# Patient Record
Sex: Male | Born: 2000 | Race: White | Hispanic: No | Marital: Single | Smoking: Never smoker
Health system: Southern US, Community
[De-identification: ages and names within clinical notes are randomized; demographics above are authoritative.]

## PROBLEM LIST (undated history)

## (undated) DIAGNOSIS — I7300 Raynaud's syndrome without gangrene: Secondary | ICD-10-CM

## (undated) DIAGNOSIS — F70 Mild intellectual disabilities: Secondary | ICD-10-CM

## (undated) DIAGNOSIS — Q998 Other specified chromosome abnormalities: Secondary | ICD-10-CM

## (undated) DIAGNOSIS — F909 Attention-deficit hyperactivity disorder, unspecified type: Secondary | ICD-10-CM

## (undated) DIAGNOSIS — Q999 Chromosomal abnormality, unspecified: Secondary | ICD-10-CM

## (undated) DIAGNOSIS — H509 Unspecified strabismus: Secondary | ICD-10-CM

## (undated) DIAGNOSIS — G809 Cerebral palsy, unspecified: Secondary | ICD-10-CM

## (undated) DIAGNOSIS — R569 Unspecified convulsions: Secondary | ICD-10-CM

## (undated) HISTORY — DX: Other specified chromosome abnormalities: Q99.8

## (undated) HISTORY — PX: EYE SURGERY: SHX253

## (undated) HISTORY — PX: MYRINGOTOMY WITH TUBE PLACEMENT: SHX5663

## (undated) HISTORY — PX: OTHER SURGICAL HISTORY: SHX169

## (undated) HISTORY — DX: Mild intellectual disabilities: F70

## (undated) HISTORY — DX: Attention-deficit hyperactivity disorder, unspecified type: F90.9

## (undated) HISTORY — DX: Raynaud's syndrome without gangrene: I73.00

## (undated) HISTORY — DX: Unspecified strabismus: H50.9

## (undated) HISTORY — DX: Cerebral palsy, unspecified: G80.9

## (undated) HISTORY — PX: STRABISMUS SURGERY: SHX218

## (undated) HISTORY — DX: Chromosomal abnormality, unspecified: Q99.9

---
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
## 2018-01-09 ENCOUNTER — Ambulatory Visit (INDEPENDENT_AMBULATORY_CARE_PROVIDER_SITE_OTHER): Payer: Medicaid Other | Admitting: Pediatrics

## 2018-01-09 ENCOUNTER — Encounter: Payer: Self-pay | Admitting: Pediatrics

## 2018-01-09 VITALS — BP 102/80 | Ht 64.5 in | Wt 145.8 lb

## 2018-01-09 DIAGNOSIS — Z79899 Other long term (current) drug therapy: Secondary | ICD-10-CM

## 2018-01-09 DIAGNOSIS — H509 Unspecified strabismus: Secondary | ICD-10-CM

## 2018-01-09 DIAGNOSIS — Q999 Chromosomal abnormality, unspecified: Secondary | ICD-10-CM | POA: Diagnosis not present

## 2018-01-09 DIAGNOSIS — F70 Mild intellectual disabilities: Secondary | ICD-10-CM | POA: Diagnosis not present

## 2018-01-09 DIAGNOSIS — F902 Attention-deficit hyperactivity disorder, combined type: Secondary | ICD-10-CM | POA: Diagnosis not present

## 2018-01-09 DIAGNOSIS — I7300 Raynaud's syndrome without gangrene: Secondary | ICD-10-CM | POA: Diagnosis not present

## 2018-01-09 DIAGNOSIS — G40209 Localization-related (focal) (partial) symptomatic epilepsy and epileptic syndromes with complex partial seizures, not intractable, without status epilepticus: Secondary | ICD-10-CM

## 2018-01-09 DIAGNOSIS — Z7189 Other specified counseling: Secondary | ICD-10-CM | POA: Diagnosis not present

## 2018-01-09 MED ORDER — STRATTERA 80 MG PO CAPS: ORAL_CAPSULE | ORAL | 2 refills | Status: DC

## 2018-01-09 MED ORDER — ATOMOXETINE HCL 25 MG PO CAPS: 25.0000 mg | ORAL_CAPSULE | Freq: Every day | ORAL | 2 refills | Status: DC

## 2018-01-09 NOTE — Patient Instructions (Addendum)
Continue strattera 80 mg every morning and 25 mg at noon Discussed medication and dosing Discussed growth and development-maintained well il Discussed school progress-doing well-discussed plans for next [facility] Mother has obtained guardianship

## 2018-01-09 NOTE — Progress Notes (Addendum)
[facility] [place]. [address] [place] [address] Dept: [phone] Dept Fax: [phone] Loc: [phone] Loc Fax: [phone]  Medical Follow-up  Patient ID: Joe Alvarez, male  DOB: 10-Oct-2001, 17 y.o.  MRN: [id number]  Date of Evaluation: 01/09/18  PCP: Marcha Solders, MD  Accompanied by: Mother Patient Lives with: parents  HISTORY/CURRENT STATUS:  HPI  Routine 3 month visit, medication check Question of occasional seizure, noted episodes of confusion, noted occasional slurred speech for a short time Planning on [employer] next year  EDUCATION: School: [employer]  Year/Grade: 12th grade Homework Time: 30 Minutes Performance/Grades: below average, EC classes Services: IEP/504 Plan Activities/Exercise: participates in PE at school and participates in [place]: Appetite: good MVI/Other: none Fruits/Vegs:good Calcium: drinks milk Iron:likes meats  Sleep: Bedtime: 9 Awakens: 6:30 Sleep Concerns: Initiation/Maintenance/Other: sleeps well  Individual Medical History/Review of System Changes? No Review of Systems  Constitutional: Negative.  Negative for chills, diaphoresis, fever, malaise/fatigue and weight loss.  HENT: Negative.  Negative for congestion, ear discharge, ear pain, hearing loss, nosebleeds, sinus pain, sore throat and tinnitus.   Eyes: Negative.  Negative for blurred vision, double vision, photophobia, pain, discharge and redness.  Respiratory: Negative.  Negative for cough, hemoptysis, sputum production, shortness of breath, wheezing and stridor.   Cardiovascular: Negative.  Negative for chest pain, palpitations, orthopnea, claudication, leg swelling and PND.  Gastrointestinal: Negative.  Negative for abdominal pain, blood in stool, constipation, diarrhea, heartburn, melena, nausea and vomiting.    Genitourinary: Negative.  Negative for dysuria, flank pain, frequency, hematuria and urgency.  Musculoskeletal: Negative.  Negative for back pain, falls, joint pain, myalgias and neck pain.  Skin: Negative.  Negative for itching and rash.  Neurological: Negative.  Negative for dizziness, tingling, tremors, sensory change, speech change, focal weakness, seizures, loss of consciousness, weakness and headaches.  Endo/Heme/Allergies: Negative.  Negative for environmental allergies and polydipsia. Does not bruise/bleed easily.  Psychiatric/Behavioral: Negative.  Negative for depression, hallucinations, memory loss, substance abuse and suicidal ideas. The patient is not nervous/anxious and does not have insomnia.     Allergies: Atropine; Cyclopentolate; and Tetanus toxoids  Current Medications:  Current Outpatient Medications:  .  atomoxetine (STRATTERA) 25 MG capsule, Take 1 capsule (25 mg total) by mouth daily. 1 capsule daily at about 12 noon., Disp: 30 capsule, Rfl: 2 .  cetirizine (ZYRTEC) 10 MG tablet, TAKE 1 TABLET (10 MG TOTAL) BY MOUTH DAILY., Disp: 30 tablet, Rfl: 12 .  docusate sodium (STOOL SOFTENER) 100 MG capsule, Take 100 mg by mouth., Disp: , Rfl:  .  fluticasone (FLONASE) 50 MCG/ACT nasal spray, 2 sprays per nostril once daily at bedtime (Patient not taking: Reported on 01/30/2017), Disp: 16 g, Rfl: 2 .  guaiFENesin (MUCINEX) 600 MG 12 hr tablet, Take 1 tablet (600 mg total) by mouth every morning. (Patient not taking: Reported on 01/30/2017), Disp: 20 tablet, Rfl: 0 .  levETIRAcetam (KEPPRA) 500 MG tablet, 1 and 1/2 in am, 2 at night, Disp: , Rfl:  .  midazolam (VERSED) 2 MG/ML syrup, Place 5 mg into the nose daily as needed. Reported on 02/29/2016, Disp: , Rfl:  .  montelukast (SINGULAIR) 10 MG tablet, Take 1 tablet (10 mg total) by mouth daily., Disp: 30 tablet, Rfl: 12 .  Multiple Vitamin (MULTIVITAMIN) capsule, Take by mouth., Disp: , Rfl:  .  Multiple Vitamin (MULTIVITAMIN) tablet,  Take 1 tablet by mouth  daily., Disp: , Rfl:  .  Oxcarbazepine (TRILEPTAL) 300 MG tablet, TAKE 1 TABLET BY MOUTH TWICE A DAY, Disp: , Rfl:  .  pyridOXINE (VITAMIN B-6) 100 MG tablet, Take 100 mg by mouth 2 (two) times daily., Disp: , Rfl:  .  STRATTERA 80 MG capsule, 1 capsule every morning., Disp: 30 capsule, Rfl: 2 Medication Side Effects: None  Family Medical/Social History Changes?: No  MENTAL HEALTH: Mental Health Issues: poor social skills  PHYSICAL EXAM: Vitals:  Today's Vitals   01/09/18 1612  BP: 102/80  Weight: 145 lb 12.8 oz (66.1 kg)  Height: 5' 4.5" (1.638 m)  PainSc: 0-No pain  , 78 %ile (Z= 0.76) based on CDC (Boys, 2-20 Years) BMI-for-age based on BMI available as of 01/09/2018.  General Exam: Physical Exam  Constitutional: He is oriented to person, place, and time. He appears well-developed and well-nourished. No distress.  HENT:  Head: Normocephalic and atraumatic.  Right Ear: External ear normal.  Left Ear: External ear normal.  Nose: Nose normal.  Mouth/Throat: Oropharynx is clear and moist. No oropharyngeal exudate.  Eyes: Conjunctivae and EOM are normal. Pupils are equal, round, and reactive to light. Right eye exhibits no discharge. Left eye exhibits no discharge. No scleral icterus.  Neck: Normal range of motion. Neck supple. No JVD present. No tracheal deviation present. No thyromegaly present.  Cardiovascular: Normal rate, regular rhythm, normal heart sounds and intact distal pulses. Exam reveals no gallop and no friction rub.  No murmur heard. Pulmonary/Chest: Effort normal and breath sounds normal. No stridor. No respiratory distress. He has no wheezes. He has no rales. He exhibits no tenderness.  Abdominal: Soft. Bowel sounds are normal. He exhibits no distension and no mass. There is no tenderness. There is no rebound and no guarding. No hernia.  Musculoskeletal: Normal range of motion. He exhibits no edema, tenderness or deformity.  Rolls feet inward    Lymphadenopathy:    He has no cervical adenopathy.  Neurological: He is alert and oriented to person, place, and time. He has normal reflexes. He displays normal reflexes. No cranial nerve deficit or sensory deficit. He exhibits normal muscle tone. Coordination normal.  Skin: Skin is warm and dry. No rash noted. He is not diaphoretic. No erythema. No pallor.  Psychiatric: He has a normal mood and affect. His behavior is normal.  Vitals reviewed.   Neurological: oriented to place and person Cranial Nerves: normal  Neuromuscular:  Motor Mass: normal Tone: normal Strength: normal DTRs:  DTRs normal Overflow: moderate Reflexes: no tremors noted, finger to nose without dysmetria bilaterally, gait was normal, difficulty with tandem and poor finger to thumb exercise, poor motor planning Sensory Exam: normal  Fine Touch: normal  Testing/Developmental Screens: AS/RS 29/13  DIAGNOSES:    ICD-10-CM   1. Mild intellectual disability F70   2. Attention deficit hyperactivity disorder (ADHD), combined type F90.2 STRATTERA 80 MG capsule    atomoxetine (STRATTERA) 25 MG capsule  3. 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 Strabismus H50.9   7. Medication management Z79.899   8. Counseling on health promotion and disease prevention Z71.89   9. Coordination of complex care Z71.89     RECOMMENDATIONS:  Patient Instructions  Continue strattera 80 mg every morning and 25 mg at noon Discussed medication and dosing Discussed growth and development-maintained well il Discussed school progress-doing well-discussed plans for next [facility] Mother has  obtained guardianship   NEXT APPOINTMENT: Return in about 3 months (around 04/25/2018), or if symptoms worsen or fail to improve, for Medical follow up.   Gery Pray, NP Counseling Time: 30 Total Contact Time: 50 More  than 50% of the visit involved counseling, discussing the diagnosis and management of symptoms with the patient and family

## 2018-04-25 ENCOUNTER — Ambulatory Visit (INDEPENDENT_AMBULATORY_CARE_PROVIDER_SITE_OTHER): Payer: Medicaid Other | Admitting: Pediatrics

## 2018-04-25 ENCOUNTER — Encounter: Payer: Self-pay | Admitting: Pediatrics

## 2018-04-25 VITALS — BP 104/70 | Ht 64.5 in | Wt 144.8 lb

## 2018-04-25 DIAGNOSIS — G40209 Localization-related (focal) (partial) symptomatic epilepsy and epileptic syndromes with complex partial seizures, not intractable, without status epilepticus: Secondary | ICD-10-CM | POA: Diagnosis not present

## 2018-04-25 DIAGNOSIS — Q999 Chromosomal abnormality, unspecified: Secondary | ICD-10-CM | POA: Diagnosis not present

## 2018-04-25 DIAGNOSIS — I7300 Raynaud's syndrome without gangrene: Secondary | ICD-10-CM

## 2018-04-25 DIAGNOSIS — Z79899 Other long term (current) drug therapy: Secondary | ICD-10-CM

## 2018-04-25 DIAGNOSIS — Z7189 Other specified counseling: Secondary | ICD-10-CM | POA: Diagnosis not present

## 2018-04-25 DIAGNOSIS — G809 Cerebral palsy, unspecified: Secondary | ICD-10-CM | POA: Diagnosis not present

## 2018-04-25 DIAGNOSIS — F70 Mild intellectual disabilities: Secondary | ICD-10-CM

## 2018-04-25 DIAGNOSIS — M2170 Unequal limb length (acquired), unspecified site: Secondary | ICD-10-CM | POA: Diagnosis not present

## 2018-04-25 DIAGNOSIS — F902 Attention-deficit hyperactivity disorder, combined type: Secondary | ICD-10-CM | POA: Diagnosis not present

## 2018-04-25 DIAGNOSIS — H509 Unspecified strabismus: Secondary | ICD-10-CM | POA: Diagnosis not present

## 2018-04-25 MED ORDER — ATOMOXETINE HCL 25 MG PO CAPS: 25.0000 mg | ORAL_CAPSULE | Freq: Every day | ORAL | 2 refills | Status: DC

## 2018-04-25 MED ORDER — STRATTERA 80 MG PO CAPS: ORAL_CAPSULE | ORAL | 2 refills | Status: DC

## 2018-04-25 NOTE — Progress Notes (Signed)
[facility] [facility] [place]. [address] [place] [address] Dept: [phone] Dept Fax: [phone] Loc: [phone] Loc Fax: [phone]  Medical Follow-up  Patient ID: [name], male  DOB: 2001-08-26, 17 y.o.  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 No, has sore spot on foot-to see PCP Review of Systems  Constitutional: Negative.  Negative for chills, diaphoresis, fever, malaise/fatigue and weight loss.  HENT: Negative.  Negative for congestion, ear discharge, ear pain, hearing loss, nosebleeds, sinus pain, sore throat and tinnitus.   Eyes: Negative.  Negative for blurred vision, double vision, photophobia, pain, discharge and redness.  Respiratory: Negative.  Negative for cough, hemoptysis, sputum production, shortness of breath, wheezing and stridor.   Cardiovascular: Negative.  Negative for chest pain, palpitations, orthopnea, claudication, leg swelling and PND.  Gastrointestinal: Negative.  Negative for abdominal pain, blood in stool, constipation, diarrhea, heartburn, melena, nausea and vomiting.  Genitourinary: Negative.  Negative for dysuria, flank pain, frequency, hematuria and urgency.  Musculoskeletal: Negative.  Negative for back pain, falls,  joint pain, myalgias and neck pain.  Skin: Negative.  Negative for itching and rash.       Red area on left foot  Neurological: Negative.  Negative for dizziness, tingling, tremors, sensory change, speech change, focal weakness, seizures, loss of consciousness, weakness and headaches.  Endo/Heme/Allergies: Negative.  Negative for environmental allergies and polydipsia. Does not bruise/bleed easily.  Psychiatric/Behavioral: Negative.  Negative for depression, hallucinations, memory loss, substance abuse and suicidal ideas. The patient is not nervous/anxious and does not have insomnia.      Allergies: Atropine; Cyclopentolate; and Tetanus toxoids  Current Medications:  Current Outpatient Medications:  .  atomoxetine (STRATTERA) 25 MG capsule, Take 1 capsule (25 mg total) by mouth daily. 1 capsule daily at about 12 noon., Disp: 30 capsule, Rfl: 2 .  cetirizine (ZYRTEC) 10 MG tablet, TAKE 1 TABLET (10 MG TOTAL) BY MOUTH DAILY., Disp: 30 tablet, Rfl: 12 .  docusate sodium (STOOL SOFTENER) 100 MG capsule, Take 100 mg by mouth., Disp: , Rfl:  .  fluticasone (FLONASE) 50 MCG/ACT nasal spray, 2 sprays per nostril once daily at bedtime (Patient not taking: Reported on 01/30/2017), Disp: 16 g, Rfl: 2 .  guaiFENesin (MUCINEX) 600 MG 12 hr tablet, Take 1 tablet (600 mg total) by mouth every morning. (Patient not taking: Reported on 01/30/2017), Disp: 20 tablet, Rfl: 0 .  levETIRAcetam (KEPPRA) 500 MG tablet, 1 and 1/2 in am, 2 at night, Disp: , Rfl:  .  midazolam (VERSED) 2 MG/ML syrup, Place 5 mg into the nose daily as needed. Reported on 02/29/2016, Disp: , Rfl:  .  montelukast (SINGULAIR) 10 MG tablet, Take 1 tablet (10 mg total) by mouth daily., Disp: 30 tablet, Rfl: 12 .  Multiple Vitamin (MULTIVITAMIN) capsule, Take by mouth., Disp: , Rfl:  .  Multiple Vitamin (MULTIVITAMIN) tablet, Take 1 tablet by mouth daily., Disp: , Rfl:  .  Oxcarbazepine (TRILEPTAL) 300 MG tablet, TAKE  1 TABLET BY MOUTH TWICE A DAY,  Disp: , Rfl:  .  pyridOXINE (VITAMIN B-6) 100 MG tablet, Take 100 mg by mouth 2 (two) times daily., Disp: , Rfl:  .  STRATTERA 80 MG capsule, 1 capsule every morning., Disp: 30 capsule, Rfl: 2 Medication Side Effects: None  Family Medical/Social History Changes?: No  MENTAL HEALTH: Mental Health Issues: immature, fair social skills  PHYSICAL EXAM: Vitals:  Today's Vitals   04/25/18 1403  BP: 104/70  Weight: 144 lb 12.8 oz (65.7 kg)  Height: 5' 4.5" (1.638 m)  PainSc: 0-No pain  , 75 %ile (Z= 0.67) based on CDC (Boys, 2-20 Years) BMI-for-age based on BMI available as of 04/25/2018.  General Exam: Physical Exam  Constitutional: He is oriented to person, place, and time. He appears well-developed and well-nourished. No distress.  HENT:  Head: Normocephalic and atraumatic.  Right Ear: External ear normal.  Left Ear: External ear normal.  Nose: Nose normal.  Mouth/Throat: Oropharynx is clear and moist. No oropharyngeal exudate.  Eyes: Pupils are equal, round, and reactive to light. Conjunctivae and EOM are normal. Right eye exhibits no discharge. Left eye exhibits no discharge. No scleral icterus.  Neck: Normal range of motion. Neck supple. No JVD present. No tracheal deviation present. No thyromegaly present.  Cardiovascular: Normal rate, regular rhythm, normal heart sounds and intact distal pulses. Exam reveals no gallop and no friction rub.  No murmur heard. Pulmonary/Chest: Effort normal and breath sounds normal. No stridor. No respiratory distress. He has no wheezes. He has no rales. He exhibits no tenderness.  Abdominal: Soft. Bowel sounds are normal. He exhibits no distension and no mass. There is no tenderness. There is no rebound and no guarding. No hernia.  Musculoskeletal: Normal range of motion. He exhibits no edema, tenderness or deformity.  Lymphadenopathy:    He has no cervical adenopathy.  Neurological: He is alert and oriented to person, place, and time. He has normal  reflexes. He displays normal reflexes. No cranial nerve deficit or sensory deficit. He exhibits normal muscle tone. Coordination normal.  Skin: Skin is warm and dry. No rash noted. He is not diaphoretic. No erythema. No pallor.  Red area on left foot  Psychiatric: He has a normal mood and affect. His behavior is normal.  Vitals reviewed.   Neurological: oriented to place and person Cranial Nerves: normal  Neuromuscular:  Motor Mass: normal Tone: normal Strength: normal DTRs: normal 2+ and symmetric Overflow: mild to moderate Reflexes: no tremors noted, finger to nose without dysmetria bilaterally, gait was normal, difficulty with tandem, no ataxic movements noted and difficulty with coordination and motor planning Sensory Exam: normal  Fine Touch: normal  Testing/Developmental Screens: CGI:4 . DIAGNOSES:    ICD-10-CM   1. Mild intellectual disability F70   2. Attention deficit hyperactivity disorder (ADHD), combined type F90.2 atomoxetine (STRATTERA) 25 MG capsule    STRATTERA 80 MG capsule  3. Cerebral palsy, unspecified type (HCC)Chronic G80.9   4. 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 Strabismus H50.9   8. Medication management Z79.899   9. Counseling on health promotion and disease prevention Z71.89   10. Coordination of complex care Z71.89   11. Leg length discrepancy M21.70     RECOMMENDATIONS:  Patient Instructions  Continue strattera 80 mg in am and 25 mg at noon Discussed medication and dosing Discussed growth and development-maintaining well Discussed  school progress-transition to [place]- has 3 quarters-mainly 1 course each quarter   NEXT APPOINTMENT: Return in about 3 months (around 08/07/2018), or if symptoms worsen or fail to improve, for Medical follow up.   Gery Pray, NP Counseling Time: 30 Total Contact Time: 40 More than 50%  of the visit involved counseling, discussing the diagnosis and management of symptoms with the patient and family

## 2018-04-25 NOTE — Patient Instructions (Addendum)
Continue strattera 80 mg in am and 25 mg at noon Discussed medication and dosing Discussed growth and development-maintaining well Discussed school progress-transition to [employer] [employer]- has 3 quarters-mainly 1 course each quarter

## 2018-05-27 ENCOUNTER — Other Ambulatory Visit: Payer: Self-pay | Admitting: Pediatrics

## 2018-05-27 DIAGNOSIS — F902 Attention-deficit hyperactivity disorder, combined type: Secondary | ICD-10-CM

## 2018-05-27 MED ORDER — STRATTERA 25 MG PO CAPS: 25.0000 mg | ORAL_CAPSULE | Freq: Every day | ORAL | 2 refills | Status: DC

## 2018-05-27 MED ORDER — STRATTERA 80 MG PO CAPS: 80.0000 mg | ORAL_CAPSULE | Freq: Every day | ORAL | 2 refills | Status: DC

## 2018-05-27 MED ORDER — STRATTERA 25 MG PO CAPS
25.0000 mg | ORAL_CAPSULE | Freq: Every day | ORAL | 2 refills | Status: DC
Start: 2018-05-27 — End: 2018-05-27

## 2018-05-27 NOTE — Telephone Encounter (Signed)
Reviewed previous medical records back to 2018. Dr Orma Render documented: "We will continue Strattera 80 mg every morning and 25 mg at about noon on school days. We discussed specified that brand is clinically necessary because Virgal was not doing as well when generic atomoxetine and Strattera were being switched back and forth randomly from month to month."  Therefore I will write new Rx and hand write on the Rx that Brand name is medically necessary, to be faxed to pharmacy

## 2018-05-27 NOTE — Telephone Encounter (Signed)
Last visit 04/25/2018 next visit 08/08/2018

## 2018-05-27 NOTE — Telephone Encounter (Signed)
Faxed to Pharm at 2:30pm

## 2018-05-27 NOTE — Telephone Encounter (Signed)
Refills sent in 6/27 with 2 additional refills Takes Strattera 80 mg + 25 mg a day E-Prescribed Strattera 80 and 25  directly to  [id number] - [place], [place] - [address] [place] [address] Phone: [phone] Fax: [phone]

## 2018-06-21 DIAGNOSIS — H47393 Other disorders of optic disc, bilateral: Secondary | ICD-10-CM | POA: Insufficient documentation

## 2018-06-28 IMAGING — CR DG CHEST 2V
2 series · 2 of 2 positions shown · non-contrast
Comparison: Chest x-ray dated 11/25/2005.

CLINICAL DATA: Pt states he has a fever and a non-productive cough
that started today. Pt has no other chest complaints.

EXAM:
CHEST  2 VIEW

[w chest pa]
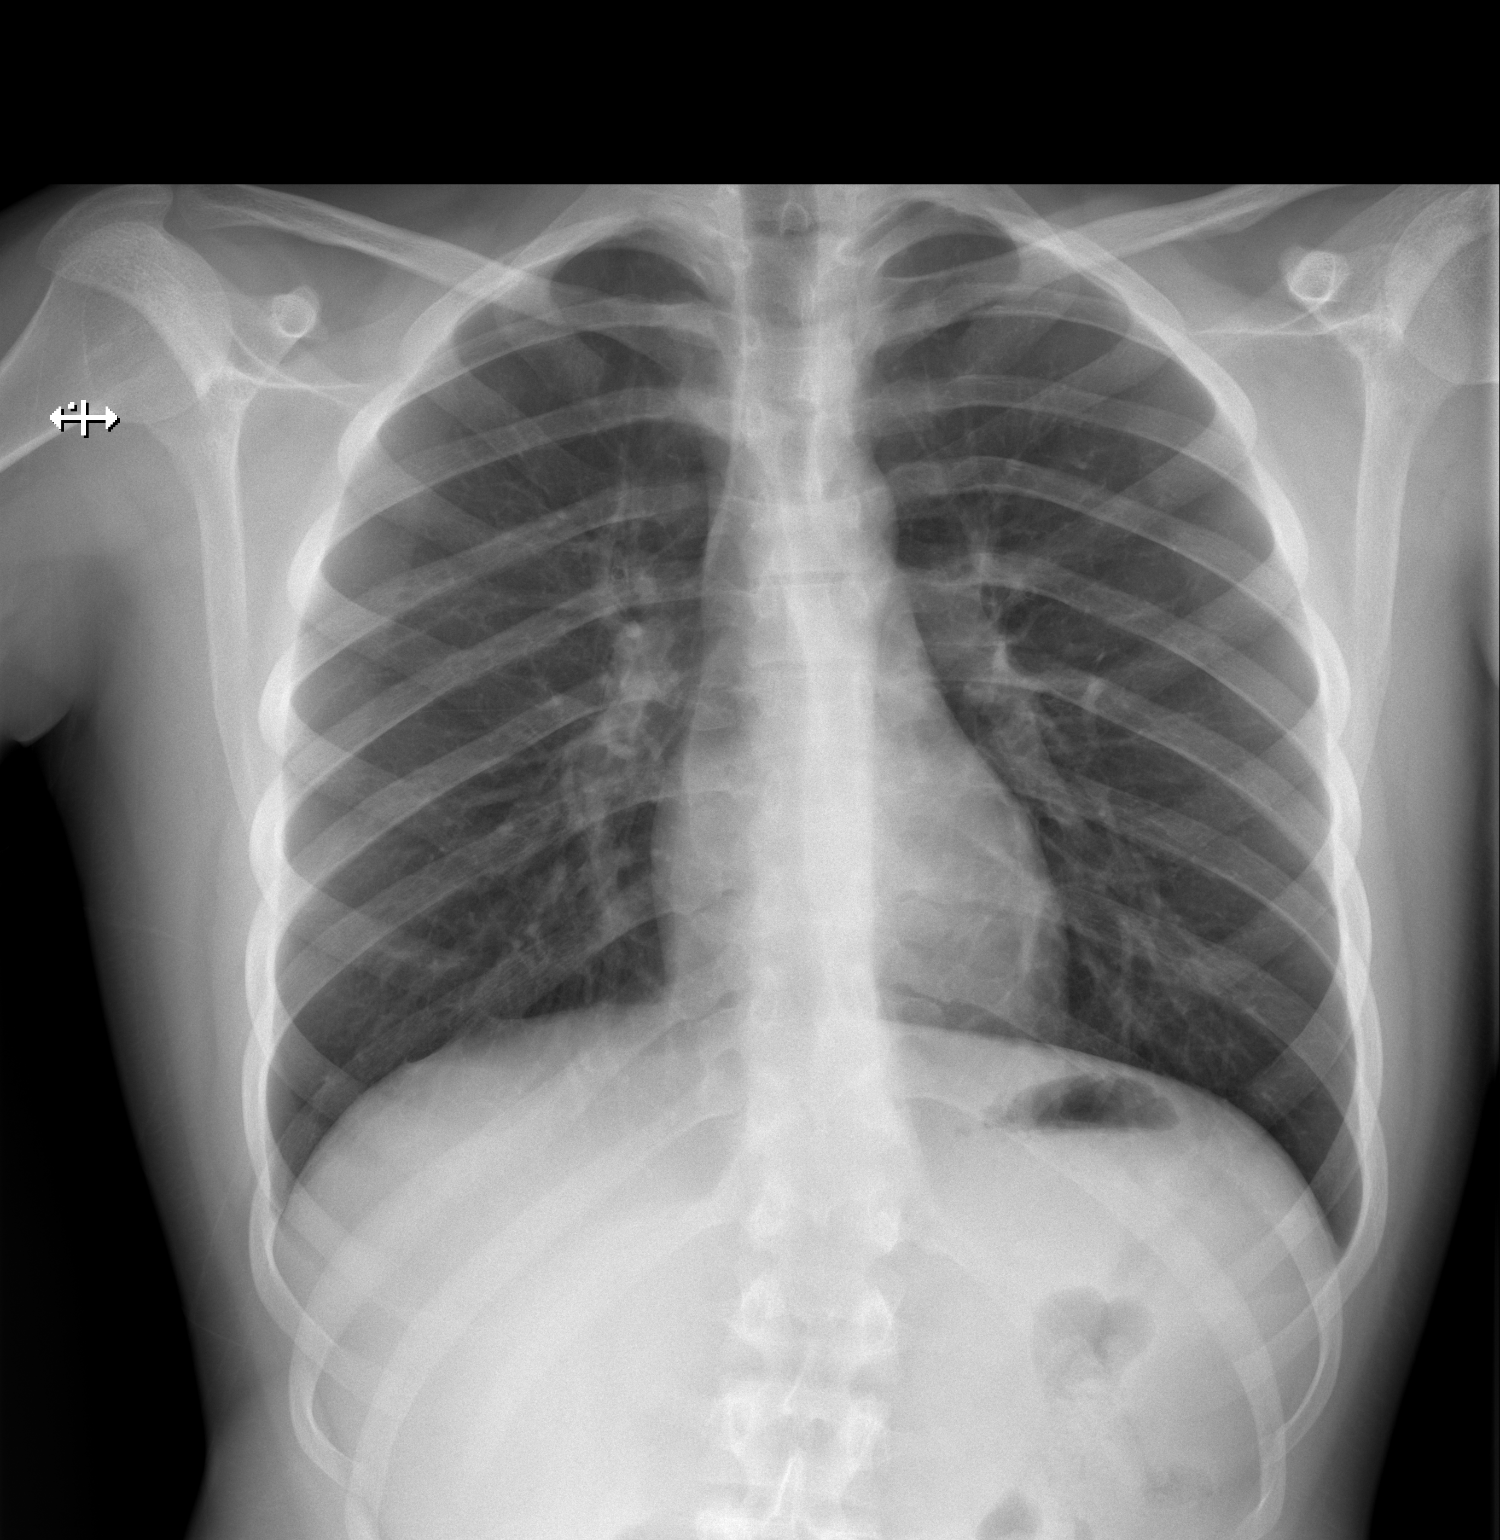

[w chest lat]
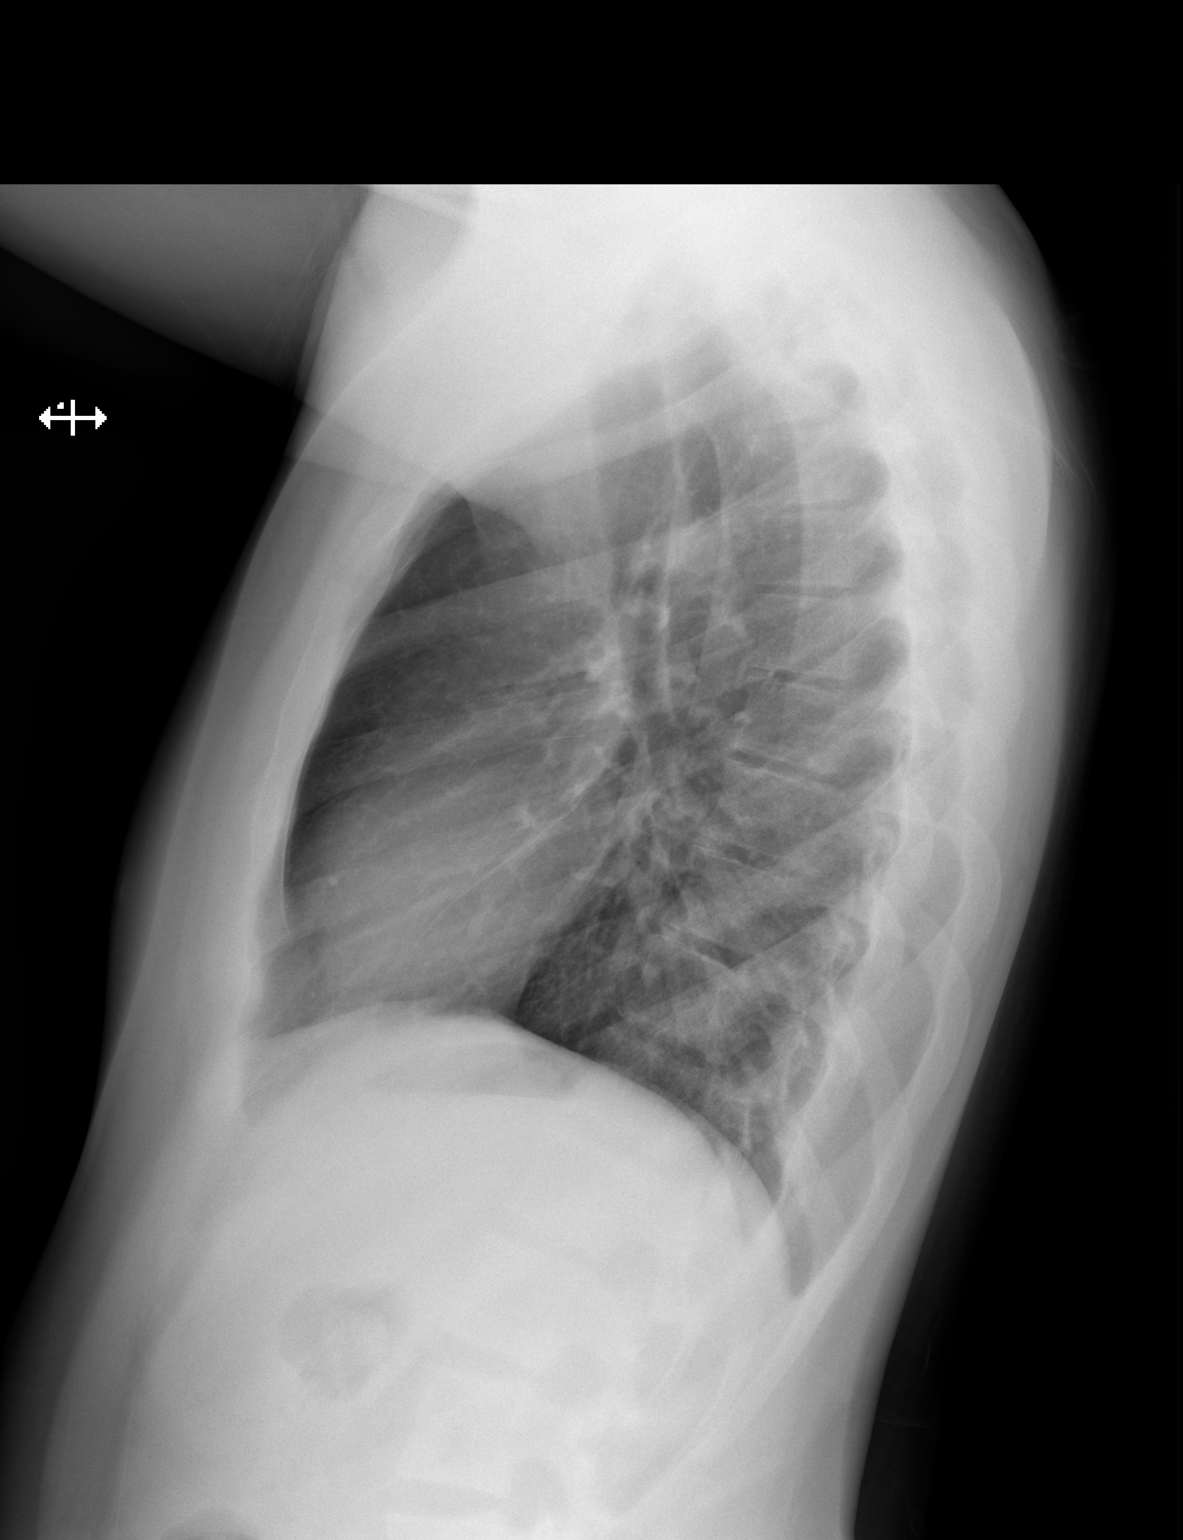

[2 of 2 positions shown; findings below may reference images not displayed]

FINDINGS: The heart size and mediastinal contours are within normal limits.
Both lungs are clear. Lung volumes are normal. Levoscoliosis noted
at the thoracolumbar junction. No acute appearing osseous finding.
IMPRESSION: No active cardiopulmonary disease.  No evidence of pneumonia.

## 2018-08-08 ENCOUNTER — Institutional Professional Consult (permissible substitution): Payer: Medicaid Other | Admitting: Pediatrics

## 2018-08-09 ENCOUNTER — Encounter: Payer: Self-pay | Admitting: Pediatrics

## 2018-08-09 ENCOUNTER — Ambulatory Visit (INDEPENDENT_AMBULATORY_CARE_PROVIDER_SITE_OTHER): Payer: Medicaid Other | Admitting: Pediatrics

## 2018-08-09 VITALS — BP 100/80 | Ht 65.0 in | Wt 148.4 lb

## 2018-08-09 DIAGNOSIS — G40209 Localization-related (focal) (partial) symptomatic epilepsy and epileptic syndromes with complex partial seizures, not intractable, without status epilepticus: Secondary | ICD-10-CM

## 2018-08-09 DIAGNOSIS — I7300 Raynaud's syndrome without gangrene: Secondary | ICD-10-CM

## 2018-08-09 DIAGNOSIS — Z79899 Other long term (current) drug therapy: Secondary | ICD-10-CM

## 2018-08-09 DIAGNOSIS — Q999 Chromosomal abnormality, unspecified: Secondary | ICD-10-CM

## 2018-08-09 DIAGNOSIS — G809 Cerebral palsy, unspecified: Secondary | ICD-10-CM

## 2018-08-09 DIAGNOSIS — Z7189 Other specified counseling: Secondary | ICD-10-CM

## 2018-08-09 DIAGNOSIS — F902 Attention-deficit hyperactivity disorder, combined type: Secondary | ICD-10-CM

## 2018-08-09 DIAGNOSIS — F70 Mild intellectual disabilities: Secondary | ICD-10-CM

## 2018-08-09 DIAGNOSIS — H509 Unspecified strabismus: Secondary | ICD-10-CM

## 2018-08-09 MED ORDER — STRATTERA 80 MG PO CAPS: 80.0000 mg | ORAL_CAPSULE | Freq: Every day | ORAL | 2 refills | Status: DC

## 2018-08-09 NOTE — Patient Instructions (Addendum)
Continue strattera 80 mg daily Discussed medication and dosing-not giving strattera 25 mg in pm at present-doing well on 80 mg Discussed growth and development-good BMI Discussed school progress-doing well Discussed seizures-no change

## 2018-08-09 NOTE — Progress Notes (Signed)
[facility] [address], [address] [place] [address] Dept: [phone] Dept Fax: [phone] Loc: [phone] Loc Fax: [phone]  Medical Follow-up  Patient ID: Joe Alvarez, male  DOB: 11-Dec-2000, 17 y.o.  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 No, early glaucome Review of Systems  Constitutional: Negative.  Negative for chills, diaphoresis, fever, malaise/fatigue and weight loss.  HENT: Negative.  Negative for congestion, ear discharge, ear pain, hearing loss, nosebleeds, sinus pain, sore throat and tinnitus.   Eyes: Negative.  Negative for blurred vision, double vision, photophobia, pain, discharge and redness.  Respiratory: Negative.  Negative for cough, hemoptysis, sputum production, shortness of breath, wheezing and stridor.   Cardiovascular: Negative.  Negative for chest pain, palpitations, orthopnea, claudication, leg swelling and PND.  Gastrointestinal: Negative.  Negative for abdominal pain, blood in stool, constipation, diarrhea, heartburn, melena, nausea and vomiting.  Genitourinary: Negative.  Negative for dysuria, flank pain, frequency, hematuria and urgency.  Musculoskeletal: Negative.  Negative for back pain, falls, joint pain, myalgias and neck pain.  Skin: Negative.  Negative for itching and rash.  Neurological:  Positive for dizziness. Negative for tingling, tremors, sensory change, speech change, focal weakness, seizures, loss of consciousness, weakness and headaches.       Short period of feeling dizzy last night at school  Endo/Heme/Allergies: Negative.  Negative for environmental allergies and polydipsia. Does not bruise/bleed easily.  Psychiatric/Behavioral: Negative.  Negative for depression, hallucinations, memory loss, substance abuse and suicidal ideas. The patient is not nervous/anxious and does not have insomnia.     Allergies: Atropine; Cyclopentolate; and Tetanus toxoids  Current Medications:  Current Outpatient Medications:  .  cetirizine (ZYRTEC) 10 MG tablet, TAKE 1 TABLET (10 MG TOTAL) BY MOUTH DAILY., Disp: 30 tablet, Rfl: 12 .  docusate sodium (STOOL SOFTENER) 100 MG capsule, Take 100 mg by mouth., Disp: , Rfl:  .  fluticasone (FLONASE) 50 MCG/ACT nasal spray, 2 sprays per nostril once daily at bedtime (Patient not taking: Reported on 01/30/2017), Disp: 16 g, Rfl: 2 .  guaiFENesin (MUCINEX) 600 MG 12 hr tablet, Take 1 tablet (600 mg total) by mouth every morning. (Patient not taking: Reported on 01/30/2017), Disp: 20 tablet, Rfl: 0 .  levETIRAcetam (KEPPRA) 500 MG tablet, 1 and 1/2 in am, 2 at night, Disp: , Rfl:  .  midazolam (VERSED) 2 MG/ML syrup, Place 5 mg into the nose daily as needed. Reported on 02/29/2016, Disp: , Rfl:  .  montelukast (SINGULAIR) 10 MG tablet, Take 1 tablet (10 mg total) by mouth daily., Disp: 30 tablet, Rfl: 12 .  Multiple Vitamin (MULTIVITAMIN) capsule, Take by mouth., Disp: , Rfl:  .  Multiple Vitamin (MULTIVITAMIN) tablet, Take 1 tablet by mouth daily., Disp: , Rfl:  .  Oxcarbazepine (TRILEPTAL) 300 MG tablet, TAKE 1 TABLET BY MOUTH TWICE A DAY, Disp: , Rfl:  .  pyridOXINE (VITAMIN B-6) 100 MG tablet, Take 100 mg by mouth 2 (two) times daily., Disp: , Rfl:  .  STRATTERA  25 MG capsule, Take 1 capsule (25 mg total) by mouth daily with lunch., Disp: 30 capsule,  Rfl: 2 .  STRATTERA 80 MG capsule, TAKE ONE CAPSULE BY MOUTH IN THE MORNING, Disp: 30 capsule, Rfl: 2 .  STRATTERA 80 MG capsule, Take 1 capsule (80 mg total) by mouth daily., Disp: 30 capsule, Rfl: 2 Medication Side Effects: None  Family Medical/Social History Changes?: No  MENTAL HEALTH: Mental Health Issues: fair social skills-delays  PHYSICAL EXAM: Vitals:  Today's Vitals   08/09/18 1002  BP: 100/80  Weight: 148 lb 6.4 oz (67.3 kg)  Height: 5\' 5"  (1.651 m)  PainSc: 0-No pain  , 75 %ile (Z= 0.68) based on CDC (Boys, 2-20 Years) BMI-for-age based on BMI available as of 08/09/2018.  General Exam: Physical Exam  Constitutional: He is oriented to person, place, and time. He appears well-developed and well-nourished. No distress.  HENT:  Head: Normocephalic and atraumatic.  Right Ear: External ear normal.  Left Ear: External ear normal.  Nose: Nose normal.  Mouth/Throat: Oropharynx is clear and moist. No oropharyngeal exudate.  Eyes: Pupils are equal, round, and reactive to light. Conjunctivae and EOM are normal. Right eye exhibits no discharge. Left eye exhibits no discharge. No scleral icterus.  Neck: Normal range of motion. Neck supple. No JVD present. No tracheal deviation present. No thyromegaly present.  Cardiovascular: Normal rate, regular rhythm, normal heart sounds and intact distal pulses. Exam reveals no gallop and no friction rub.  No murmur heard. Pulmonary/Chest: Effort normal and breath sounds normal. No stridor. No respiratory distress. He has no wheezes. He has no rales. He exhibits no tenderness.  Abdominal: Soft. Bowel sounds are normal. He exhibits no distension and no mass. There is no tenderness. There is no rebound and no guarding. No hernia.  Musculoskeletal: Normal range of motion. He exhibits no edema, tenderness or deformity.  Lymphadenopathy:    He has no cervical adenopathy.  Neurological: He is alert and oriented to person, place, and time. He has  normal reflexes. He displays normal reflexes. No cranial nerve deficit or sensory deficit. He exhibits normal muscle tone. Coordination normal.  Skin: Skin is warm and dry. No rash noted. He is not diaphoretic. No erythema. No pallor.  Psychiatric: He has a normal mood and affect. His behavior is normal. Judgment and thought content normal.  Vitals reviewed.   Neurological: oriented to time, place, and person Cranial Nerves: normal  Neuromuscular:  Motor Mass: normal Tone: normal Strength: normal DTRs: 2+ and symmetric Overflow: mild Reflexes: no tremors noted, finger to nose without dysmetria bilaterally, performs thumb to finger exercise without difficulty, gait was normal, difficulty with tandem and no ataxic movements noted Sensory Exam: normal  Fine Touch: normal  Testing/Developmental Screens: CGI:10  DIAGNOSES:    ICD-10-CM   1. Cerebral palsy, unspecified type ([place]) G80.9   2. Attention deficit hyperactivity disorder (ADHD), combined type F90.2 STRATTERA 80 MG capsule    DISCONTINUED: STRATTERA 80 MG capsule  3. Mild intellectual disability F70   4. 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 Strabismus H50.9   8. Medication management Z79.899   9. Counseling on health promotion and disease prevention Z71.89   10. Coordination of complex care Z71.89     RECOMMENDATIONS:  Patient Instructions  Continue strattera 80 mg daily Discussed medication and dosing-not giving strattera 25 mg in pm at present-doing well on 80  mg Discussed growth and development-good BMI Discussed school progress-doing well Discussed seizures-no change   NEXT APPOINTMENT: Return in about 3 months (around 11/09/2018), or if symptoms worsen or fail to improve, for Medical follow up.   Gery Pray, NP Counseling Time: 30 Total Contact Time: 40 More than 50% of the visit  involved counseling, discussing the diagnosis and management of symptoms with the patient and family

## 2018-08-16 ENCOUNTER — Telehealth: Payer: Self-pay

## 2018-08-16 NOTE — Telephone Encounter (Signed)
Pharm faxed in Prior Auth for Strattera. Last visit 08/09/2018 next visit 11/08/2018. Submitting Prior Auth to [employer]

## 2018-08-16 NOTE — Telephone Encounter (Signed)
Approval Entry Complete  Confirmation #: [id number] Prior Approval #: [id number] Status: APPROVED

## 2018-08-26 ENCOUNTER — Encounter: Payer: Self-pay | Admitting: Pediatrics

## 2018-08-26 ENCOUNTER — Ambulatory Visit (INDEPENDENT_AMBULATORY_CARE_PROVIDER_SITE_OTHER): Payer: Medicaid Other | Admitting: Pediatrics

## 2018-08-26 VITALS — BP 114/78 | Ht 64.5 in | Wt 149.8 lb

## 2018-08-26 DIAGNOSIS — I7300 Raynaud's syndrome without gangrene: Secondary | ICD-10-CM | POA: Diagnosis not present

## 2018-08-26 DIAGNOSIS — I6789 Other cerebrovascular disease: Secondary | ICD-10-CM

## 2018-08-26 DIAGNOSIS — Q998 Other specified chromosome abnormalities: Secondary | ICD-10-CM

## 2018-08-26 DIAGNOSIS — Z0000 Encounter for general adult medical examination without abnormal findings: Secondary | ICD-10-CM | POA: Insufficient documentation

## 2018-08-26 DIAGNOSIS — H47393 Other disorders of optic disc, bilateral: Secondary | ICD-10-CM

## 2018-08-26 DIAGNOSIS — Z0001 Encounter for general adult medical examination with abnormal findings: Secondary | ICD-10-CM

## 2018-08-26 DIAGNOSIS — Z23 Encounter for immunization: Secondary | ICD-10-CM | POA: Diagnosis not present

## 2018-08-26 DIAGNOSIS — G40909 Epilepsy, unspecified, not intractable, without status epilepticus: Secondary | ICD-10-CM

## 2018-08-26 DIAGNOSIS — Z68.41 Body mass index (BMI) pediatric, 5th percentile to less than 85th percentile for age: Secondary | ICD-10-CM | POA: Diagnosis not present

## 2018-08-26 MED ORDER — CETIRIZINE HCL 10 MG PO TABS: ORAL_TABLET | ORAL | 12 refills | Status: DC

## 2018-08-26 MED ORDER — MONTELUKAST SODIUM 10 MG PO TABS: 10.0000 mg | ORAL_TABLET | Freq: Every day | ORAL | 12 refills | Status: DC

## 2018-08-26 NOTE — Progress Notes (Signed)
Adolescent Well Care Visit Joe Alvarez is a 17 y.o. male who is here for well care.    PCP:  Marcha Solders, MD   History was provided by the patient and mother.  Confidentiality was discussed with the patient and, if applicable, with caregiver as well.    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 Nutrition: Nutrition/Eating Behaviors: good Adequate calcium in diet?: yes Supplements/ Vitamins: yes  Exercise/ Media: Play any Sports?/ Exercise: none Screen Time:  < 2 hours Media Rules or Monitoring?: yes  Sleep:  Sleep: good  Social Screening: Lives with:  parents Parental relations:  good Activities, Work, and Research officer, political party?: yes Concerns regarding behavior with peers?  no Stressors of note: no  Education:  School Grade: 11 School performance: doing well; no concerns School Behavior: doing well; no concerns  Menstruation:   No LMP for male patient. Menstrual History: n/a   Confidential Social History: Tobacco?  no Secondhand smoke exposure?  no Drugs/ETOH?  no  Sexually Active?  no   Pregnancy Prevention: n/a  Safe at home, in school & in relationships?  Yes Safe to self?  Yes   Screenings: Patient has a dental home: yes  The patient completed the Rapid Assessment for Adolescent Preventive Services screening questionnaire and the following topics were identified as risk factors and discussed: healthy eating, exercise, seatbelt use, bullying, abuse/trauma, weapon use, tobacco use, marijuana use, drug use and birth control  In addition, the following topics were discussed as part of anticipatory guidance birth  control, sexuality, suicidality/self harm, mental health issues, social isolation, school problems, family problems and screen time.  PHQ-9 completed and results indicated no risk  Physical Exam:  Vitals:   08/26/18 1611  BP: 114/78  Weight: 149 lb 12.8 oz (67.9 kg)  Height: 5' 4.5" (1.638 m)   BP 114/78   Ht 5' 4.5" (1.638 m)   Wt 149 lb 12.8 oz (67.9 kg)   BMI 25.32 kg/m  Body mass index: body mass index is 25.32 kg/m. Blood pressure percentiles are not available for patients who are 18 years or older.   Hearing Screening   125Hz  250Hz  500Hz  1000Hz  2000Hz  3000Hz  4000Hz  6000Hz  8000Hz   Right ear:   25 20 20 20 20     Left ear:   25 20 20 20 20       Visual Acuity Screening   Right eye Left eye Both eyes  Without correction: 10/16 10/10   With correction:       General Appearance:   alert, oriented, no acute distress and well nourished  HENT: Normocephalic, no obvious abnormality, conjunctiva clear  Mouth:   Normal appearing teeth, no obvious discoloration, dental caries, or dental caps  Neck:   Supple; thyroid: no enlargement, symmetric, no tenderness/mass/nodules  Chest normal  Lungs:   Clear to auscultation bilaterally, normal work of breathing  Heart:   Regular rate and rhythm, S1 and S2 normal, no murmurs;   Abdomen:   Soft, non-tender, no mass, or organomegaly  GU normal male genitals, no testicular masses or hernia  Musculoskeletal:   Tone and strength strong and symmetrical, all extremities  Lymphatic:   No cervical adenopathy  Skin/Hair/Nails:   Skin warm, dry and intact, no rashes, no bruises or petechiae  Neurologic:   Strength, gait, and coordination normal and age-appropriate     Assessment and Plan:   Well male---chronic disorders--see list  BMI is appropriate for age  Hearing screening result:normal Vision screening result: normal  Counseling provided for all of the vaccine components  Orders Placed This Encounter  Procedures  . Flu  Vaccine QUAD 6+ mos PF IM (Fluarix Quad PF)   Indications, contraindications and side effects of vaccine/vaccines discussed with parent and parent verbally expressed understanding and also agreed with the administration of vaccine/vaccines as ordered above today.Handout (VIS) given for each vaccine at this visit.  Counseling provided for the following MEN B vaccine components--mom refused.   Return in about 1 year (around 08/27/2019).Marcha Solders, MD

## 2018-08-26 NOTE — Patient Instructions (Signed)
Preventive Care for [place], Male The transition to life after high school as a young adult can be a stressful time with many changes. You may start seeing a primary care physician instead of a pediatrician. This is the time when your health care becomes your responsibility. Preventive care refers to lifestyle choices and visits with your health care provider that can promote health and wellness. What does preventive care include?  A yearly physical exam. This is also called an annual wellness visit.  Dental exams once or twice a year.  Routine eye exams. Ask your health care provider how often you should have your eyes checked.  Personal lifestyle choices, including: ? Daily care of your teeth and gums. ? Regular physical activity. ? Eating a healthy diet. ? Avoiding tobacco and drug use. ? Avoiding or limiting alcohol use. ? Practicing safe sex. What happens during an annual wellness visit? Preventive care starts with a yearly visit to your primary care physician. The services and screenings done by your health care provider during your annual wellness visit will depend on your overall health, lifestyle risk factors, and family history of disease. Counseling Your health care provider may ask you questions about:  Past medical problems and your family's medical history.  Medicines or supplements that you take.  Health insurance and access to health care.  Alcohol, tobacco, and drug use, including use of any bodybuilding drugs (anabolic steroids).  Your safety at home, work, or school.  Access to firearms.  Emotional well-being and how you cope with stress.  Relationship well-being.  Diet, exercise, and sleep habits.  Your sexual health and activity.  Screening You may have the following tests or measurements:  Height, weight, and BMI.  Blood pressure.  Lipid and cholesterol levels.  Tuberculosis skin test.  Skin exam.  Vision and hearing tests.  Genital  exam to check for testicular cancer or hernias.  Screening test for hepatitis.  Screening tests for STDs (sexually transmitted diseases), if you are at risk.  Vaccines Your health care provider may recommend certain vaccines, such as:  Influenza vaccine. This is recommended every year.  Tetanus, diphtheria, and acellular pertussis (Tdap, Td) vaccine. You may need a Td booster every 10 years.  Varicella vaccine. You may need this if you have not been vaccinated.  HPV vaccine. If you are 8 or younger, you may need three doses over 6 months.  Measles, mumps, and rubella (MMR) vaccine. You may need at least one dose of MMR. You may also need a second dose.  Pneumococcal 13-valent conjugate (PCV13) vaccine. You may need this if you have certain conditions and have not been vaccinated.  Pneumococcal polysaccharide (PPSV23) vaccine. You may need one or two doses if you smoke cigarettes or if you have certain conditions.  Meningococcal vaccine. One dose is recommended if you are age 83-21 years and a first-year college student living in a residence hall, or if you have one of several medical conditions. You may also need additional booster doses.  Hepatitis A vaccine. You may need this if you have certain conditions or if you travel or work in places where you may be exposed to hepatitis A.  Hepatitis B vaccine. You may need this if you have certain conditions or if you travel or work in places where you may be exposed to hepatitis B.  Haemophilus influenzae type b (Hib) vaccine. You may need this if you have certain risk factors.  Talk to your health care provider about which  screenings and vaccines you need and how often you need them. What steps can I take to develop healthy behaviors?  Have regular preventive health care visits with your primary care physician and dentist.  Eat a healthy diet.  Drink enough fluid to keep your urine clear or pale yellow.  Stay active. Exercise at  least 30 minutes 5 or more days of the week.  Use alcohol responsibly.  Maintain a healthy weight.  Do not use any products that contain nicotine, such as cigarettes, chewing tobacco, and e-cigarettes. If you need help quitting, ask your health care provider.  Do not use drugs.  Practice safe sex. This includes using condoms to prevent STDs or an unwanted pregnancy.  Find healthy ways to manage stress. How can I protect myself from injury? Injuries from violence or accidents are the leading cause of death among young adults and can often be prevented. Take these steps to help protect yourself:  Always wear your seat belt while driving or riding in a vehicle.  Do not drive if you have been drinking alcohol. Do not ride with someone who has been drinking.  Do not drive when you are tired or distracted. Do not text while driving.  Wear a helmet and other protective equipment during sports activities.  If you have firearms in your house, make sure you follow all gun safety procedures.  Seek help if you have been bullied, physically abused, or sexually abused.  Avoid fighting.  Use the Internet responsibly to avoid dangers such as online bullying.  What can I do to cope with stress? Young adults may face many new challenges that can be stressful, such as finding a job, going to college, moving away from home, managing money, being in a relationship, getting married, and having children. To manage stress:  Avoid known stressful situations when you can.  Exercise regularly.  Find a stress-reducing activity that works best for you. Examples include meditation, yoga, listening to music, or reading.  Spend time in nature.  Keep a journal to write about your stress and how you respond.  Talk to your health care provider about stress. He or she may suggest counseling.  Spend time with supportive friends or family.  Do not cope with stress by: ? Drinking alcohol or using  drugs. ? Smoking cigarettes. ? Eating.  Where can I get more information? Learn more about preventive care and healthy habits from:  U.S. Preventive Services Task Force: StageSync.si  National Adolescent and [place]: StrategicRoad.nl  American Academy of Pediatrics Bright Futures: https://brightfutures.MemberVerification.co.za  Society for Adolescent Health and Medicine: MoralBlog.co.za.aspx  PodExchange.nl: ToyLending.fr  This information is not intended to replace advice given to you by your health care provider. Make sure you discuss any questions you have with your health care provider. Document Released: 03/02/2016 Document Revised: 03/23/2016 Document Reviewed: 03/02/2016 Elsevier Interactive Patient Education  [employer].

## 2018-09-19 ENCOUNTER — Telehealth: Payer: Self-pay | Admitting: Pediatrics

## 2018-09-19 NOTE — Telephone Encounter (Signed)
° ° °  Faxed office notes to [facility]. tl

## 2018-11-08 ENCOUNTER — Encounter: Payer: Self-pay | Admitting: Family

## 2018-11-08 ENCOUNTER — Ambulatory Visit (INDEPENDENT_AMBULATORY_CARE_PROVIDER_SITE_OTHER): Payer: Medicaid Other | Admitting: Family

## 2018-11-08 ENCOUNTER — Institutional Professional Consult (permissible substitution): Payer: Medicaid Other | Admitting: Family

## 2018-11-08 VITALS — BP 102/62 | HR 76 | Resp 16 | Ht 65.0 in | Wt 147.4 lb

## 2018-11-08 DIAGNOSIS — Q998 Other specified chromosome abnormalities: Secondary | ICD-10-CM | POA: Diagnosis not present

## 2018-11-08 DIAGNOSIS — I7300 Raynaud's syndrome without gangrene: Secondary | ICD-10-CM | POA: Diagnosis not present

## 2018-11-08 DIAGNOSIS — Z7189 Other specified counseling: Secondary | ICD-10-CM

## 2018-11-08 DIAGNOSIS — G40909 Epilepsy, unspecified, not intractable, without status epilepticus: Secondary | ICD-10-CM

## 2018-11-08 DIAGNOSIS — I6789 Other cerebrovascular disease: Secondary | ICD-10-CM

## 2018-11-08 DIAGNOSIS — Z719 Counseling, unspecified: Secondary | ICD-10-CM

## 2018-11-08 DIAGNOSIS — Q9989 Other specified chromosome abnormalities: Secondary | ICD-10-CM

## 2018-11-08 DIAGNOSIS — Z79899 Other long term (current) drug therapy: Secondary | ICD-10-CM

## 2018-11-08 DIAGNOSIS — H509 Unspecified strabismus: Secondary | ICD-10-CM | POA: Diagnosis not present

## 2018-11-08 DIAGNOSIS — J4520 Mild intermittent asthma, uncomplicated: Secondary | ICD-10-CM

## 2018-11-08 DIAGNOSIS — F902 Attention-deficit hyperactivity disorder, combined type: Secondary | ICD-10-CM

## 2018-11-08 DIAGNOSIS — G809 Cerebral palsy, unspecified: Secondary | ICD-10-CM

## 2018-11-08 DIAGNOSIS — F70 Mild intellectual disabilities: Secondary | ICD-10-CM

## 2018-11-08 DIAGNOSIS — Z87898 Personal history of other specified conditions: Secondary | ICD-10-CM

## 2018-11-08 MED ORDER — STRATTERA 80 MG PO CAPS: ORAL_CAPSULE | ORAL | 2 refills | Status: DC

## 2018-11-08 NOTE — Progress Notes (Signed)
[facility] [address] [place] [address] Dept: [phone] Dept Fax: [phone] Loc: [phone] Loc Fax: [phone]  Medical Follow-up  Patient ID: Joe Alvarez, male  DOB: 2001/01/31, 18 y.o.  MRN: [id number]  Date of Evaluation: 11/08/2018  PCP: Marcha Solders, MD  Accompanied by: Mother Patient Lives with: parents  HISTORY/CURRENT STATUS:  HPI  Patient here for routine follow up related to ADHD, Mild ID, CP,  and medication management. Patient here with mother for today's visit. Patient interactive and answering questions from provider. Patient doing well in his current program and looking for job placement after his certification. Has a GF now and more social interaction at the [facility] along with [name]. NO changes in medication since last visit. Still taking only the 80 mg Strattera daily with no side effects.   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   MEDICAL HISTORY: Appetite: Good MVI/Other: MVI and B-6  Fruits/Vegs:some Calcium: some  Iron:some-deer, chicken, fish, hamburger, eggs.   Sleep: Bedtime: 11:00 pm  Awakens: 9:00 am  Sleep Concerns: Initiation/Maintenance/Other: None reported recently, but not on a regular schedule.   Individual Medical History/Review of System Changes? Yes, recent f/u visit. [facility], Peds, Copywriter, advertising, Psychologist, and [employer]. Was possibly looking at Glaucoma, but ruled this out. New shoe inserts 2 weeks ago.  Allergies: Atropine; Cyclopentolate; and Tetanus toxoids  Current  Medications:  Current Outpatient Medications:  .  docusate sodium (STOOL SOFTENER) 100 MG capsule, Take 100 mg by mouth., Disp: , Rfl:  .  levETIRAcetam (KEPPRA) 500 MG tablet, 1 and 1/2 in am, 2 at night, Disp: , Rfl:  .  Multiple Vitamin (MULTIVITAMIN) capsule, Take by mouth., Disp: , Rfl:  .  Oxcarbazepine (TRILEPTAL) 300 MG tablet, TAKE 1 TABLET BY MOUTH TWICE A DAY, Disp: , Rfl:  .  pyridOXINE (VITAMIN B-6) 100 MG tablet, Take 100 mg by mouth 2 (two) times daily., Disp: , Rfl:  .  STRATTERA 25 MG capsule, Take 1 capsule (25 mg total) by mouth daily with lunch., Disp: 30 capsule, Rfl: 2 .  STRATTERA 80 MG capsule, TAKE ONE CAPSULE BY MOUTH IN THE MORNING, Disp: 30 capsule, Rfl: 2 .  cetirizine (ZYRTEC) 10 MG tablet, TAKE 1 TABLET (10 MG TOTAL) BY MOUTH DAILY. (Patient taking differently: Take 10 mg by mouth daily. TAKE 1 TABLET (10 MG TOTAL) BY MOUTH DAILY.), Disp: 30 tablet, Rfl: 12 .  montelukast (SINGULAIR) 10 MG tablet, Take 1 tablet (10 mg total) by mouth daily., Disp: 30 tablet, Rfl: 12 Medication Side Effects: None  Family Medical/Social History Changes?: None   MENTAL HEALTH: Mental Health Issues: none reported, patient with some worry about job after school  PHYSICAL EXAM: Vitals:  Today's Vitals   11/08/18 0910  BP: 102/62  Pulse: 76  Resp: 16  Weight: 147 lb 6.4 oz (66.9 kg)  Height: 5\' 5"  (1.651 m)  PainSc: 0-No pain  , 72 %ile (Z= 0.59) based on CDC (Boys, 2-20 Years) BMI-for-age based on BMI available as of 11/08/2018.  General Exam: Physical Exam Vitals signs reviewed.  Constitutional:      Appearance: He is well-developed.  HENT:     Head:  Normocephalic and atraumatic.     Right Ear: External ear normal.     Left Ear: External ear normal.     Nose: Nose normal.  Eyes:     Conjunctiva/sclera: Conjunctivae normal.     Pupils: Pupils are equal, round, and reactive to light.  Neck:     Musculoskeletal: Full passive range of motion without pain, normal range  of motion and neck supple.     Trachea: Trachea normal.  Cardiovascular:     Rate and Rhythm: Normal rate and regular rhythm.     Heart sounds: Normal heart sounds.  Pulmonary:     Effort: Pulmonary effort is normal.     Breath sounds: Normal breath sounds.  Abdominal:     General: Bowel sounds are normal.     Palpations: Abdomen is soft.  Musculoskeletal: Normal range of motion.  Skin:    General: Skin is warm and dry.  Neurological:     Mental Status: He is alert and oriented to person, place, and time.     Deep Tendon Reflexes: Reflexes are normal and symmetric.  Psychiatric:        Behavior: Behavior normal.        Thought Content: Thought content normal.        Judgment: Judgment normal.    Neurological: oriented to time, place, and person Cranial Nerves: normal  Neuromuscular:  Motor Mass: Normal  Tone: Normal  Strength: Normal  DTRs: 2+ and symmetric Overflow: mild Reflexes: no tremors noted  Sensory Exam: Vibratory: normal   Fine Touch: normal   Testing/Developmental Screens:  ASRS-28/11 scored by mother and patinet with counseling provided  DIAGNOSES:    ICD-10-CM   1. Attention deficit hyperactivity disorder (ADHD), combined type F90.2 STRATTERA 80 MG capsule  2. Raynaud's disease without gangrene I73.00   3. Strabismus H50.9   4. 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 Cerebral seizure I67.89   7. Asthma, mild intermittent, well-controlled J45.20   8. Cerebral palsy, unspecified type ([place]) G80.9   9. Mild intellectual disability F70   10. Medication management Z79.899   11. Patient counseled Z71.9   12. History of learning disability Z87.898   13. Complex care coordination Z71.89     RECOMMENDATIONS: 3 month follow up and continuation of medication. Strattera 80 mg daily, # 30 with 2 RF's. RX for above e-scribed and sent to pharmacy on record  [id number] [place], [address] [place] [address] Phone: [phone] Fax: [phone]  Counseling at this visit included the review of old records and/or current chart with the patient & parent with updates since last visit.   Discussed recent history and today's examination with patient & parent with no changes on exam.   Counseled regarding  growth and development reviewed today- 72 %ile (Z= 0.59) based on CDC (Boys, 2-20 Years) BMI-for-age based on BMI available as of 11/08/2018.  Will continue to monitor.   Recommended a high protein, low sugar diet for ADHD patients, avoid sugary snacks and drinks, drink more water, eat more fruits and vegetables, increase daily exercise.  Discussed school academic and behavioral progress and advocated for appropriate accommodations as needed for learning support.   Discussed importance of maintaining structure, routine, organization, reward, motivation and consequences with consistency at home, school and peer interactions.   Counseled medication pharmacokinetics, options, dosage, administration, desired effects,  and possible side effects.    Advised importance of:  Good sleep hygiene (8- 10 hours per night, no TV or video games for 1 hour before bedtime) Limited screen time (none on school nights, no more than 2 hours/day on weekends, use of screen time for motivation) Regular exercise(outside and active play) Healthy eating (drink water or milk, no sodas/sweet tea, limit portions and no seconds).   NEXT APPOINTMENT: Return in about 3 months (around 02/07/2019) for follow up visit.  More than 50% of the appointment was spent counseling and discussing diagnosis and management of symptoms with the patient and family.  Carolann Littler, NP Counseling Time: 45 mins Total Contact Time: 55 mins

## 2019-02-06 ENCOUNTER — Institutional Professional Consult (permissible substitution): Payer: Medicaid Other | Admitting: Family

## 2019-02-12 ENCOUNTER — Other Ambulatory Visit: Payer: Self-pay

## 2019-02-12 ENCOUNTER — Ambulatory Visit (INDEPENDENT_AMBULATORY_CARE_PROVIDER_SITE_OTHER): Payer: Medicaid Other | Admitting: Family

## 2019-02-12 ENCOUNTER — Encounter: Payer: Self-pay | Admitting: Family

## 2019-02-12 DIAGNOSIS — F900 Attention-deficit hyperactivity disorder, predominantly inattentive type: Secondary | ICD-10-CM | POA: Diagnosis not present

## 2019-02-12 DIAGNOSIS — M2170 Unequal limb length (acquired), unspecified site: Secondary | ICD-10-CM

## 2019-02-12 DIAGNOSIS — I7300 Raynaud's syndrome without gangrene: Secondary | ICD-10-CM

## 2019-02-12 DIAGNOSIS — Q100 Congenital ptosis: Secondary | ICD-10-CM

## 2019-02-12 DIAGNOSIS — H47393 Other disorders of optic disc, bilateral: Secondary | ICD-10-CM

## 2019-02-12 DIAGNOSIS — G40109 Localization-related (focal) (partial) symptomatic epilepsy and epileptic syndromes with simple partial seizures, not intractable, without status epilepticus: Secondary | ICD-10-CM

## 2019-02-12 DIAGNOSIS — Q9388 Other microdeletions: Secondary | ICD-10-CM

## 2019-02-12 DIAGNOSIS — I6789 Other cerebrovascular disease: Secondary | ICD-10-CM

## 2019-02-12 DIAGNOSIS — F902 Attention-deficit hyperactivity disorder, combined type: Secondary | ICD-10-CM

## 2019-02-12 DIAGNOSIS — H509 Unspecified strabismus: Secondary | ICD-10-CM | POA: Diagnosis not present

## 2019-02-12 DIAGNOSIS — Z79899 Other long term (current) drug therapy: Secondary | ICD-10-CM

## 2019-02-12 DIAGNOSIS — Z719 Counseling, unspecified: Secondary | ICD-10-CM

## 2019-02-12 DIAGNOSIS — G40909 Epilepsy, unspecified, not intractable, without status epilepticus: Secondary | ICD-10-CM

## 2019-02-12 DIAGNOSIS — G808 Other cerebral palsy: Secondary | ICD-10-CM

## 2019-02-12 DIAGNOSIS — Q998 Other specified chromosome abnormalities: Secondary | ICD-10-CM

## 2019-02-12 DIAGNOSIS — F70 Mild intellectual disabilities: Secondary | ICD-10-CM

## 2019-02-12 MED ORDER — STRATTERA 80 MG PO CAPS: ORAL_CAPSULE | ORAL | 2 refills | Status: DC

## 2019-02-12 NOTE — Progress Notes (Signed)
Joe Alvarez. [address] [place] [address] Dept: [phone] Dept Fax: [phone]  Medication Check visit via Virtual Video due to COVID-19  Patient ID:  Joe Alvarez  male DOB: 04-Mar-2001   18 y.o.   MRN: [id number]   DATE:02/12/19  PCP: Joe Solders, MD  Virtual Visit via Video Note  I connected with  Joe Alvarez  's Mother (Name Joe Alvarez) on 02/12/19 at 10:00 AM EDT by a video enabled telemedicine application and verified that I am speaking with the correct person using two identifiers.   I discussed the limitations of evaluation and management by telemedicine and the availability of in person appointments. The patient & parent expressed understanding and agreed to proceed.  Parent Location: at home Provider Locations: private residence   HISTORY/CURRENT STATUS: Joe Alvarez is here for medication management of the psychoactive medications for ADHD and review of educational and behavioral concerns.  Joe Alvarez currently taking Strattera 80 mg daily in the morning when he wakes up.  Medication tends to last for the entire day. Joe Alvarez is able to focus through school/homework. 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   Joe Alvarez is eating well (eating breakfast, lunch and dinner). No changes with eating, maybe less. He is required to make himself breakfast and lunch with microwave or easy meals and mother cooks dinner.   Sleeping well (goes to bed at 10-10:30 pm wakes at 9-9:30 am), sleeping through the night. Change in schedule recently with online schooling.   Joe Alvarez denies thoughts of hurting self or others, denies depression, anxiety, or fears.   EDUCATION: School: [employer]. 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 Has accumulated 160 hours and needs 320 hours for the state exam.  [name] is currently out of school due to social distancing due to COVID-19 online schooling for the remainder of the school year.   Activities/ Exercise: intermittently  Screen time: (phone, tablet, TV, computer): online and TV  MEDICAL HISTORY: Individual Medical History/ Review of Systems: Changes? Joe Alvarez, neurology f/u and will have f/u for EEG of the brain related to history of seizure disorder.   Family Medical/ Social History: Changes? None reported recently  Current Medications:  Outpatient Encounter Medications as of 02/12/2019  Medication Sig Note  . cetirizine (ZYRTEC) 10 MG tablet Take 10 mg by mouth daily.   Marland Kitchen docusate sodium (STOOL SOFTENER) 100 MG capsule Take 100 mg by mouth. 11/08/2016: Received from: [facility] Received Sig: Take 100 mg by mouth daily. Take on Mon, Wed, Fri   . levETIRAcetam (KEPPRA) 500 MG tablet 1 and 1/2 in am, 2 at night 11/08/2016: Received from: [facility]  . montelukast (SINGULAIR) 10 MG tablet Take 10 mg by mouth daily.   . Multiple Vitamin (MULTIVITAMIN) capsule Take by mouth. 11/08/2016: Received from: [facility] Received Sig: Take 1 capsule by mouth daily.    . Oxcarbazepine (TRILEPTAL) 300 MG tablet TAKE 1 TABLET BY MOUTH TWICE A DAY 11/08/2016: Received from: [facility]  .  pyridOXINE (VITAMIN B-6) 100 MG tablet Take 100 mg by mouth 2 (two) times daily.   Marland Kitchen STRATTERA 80 MG capsule TAKE ONE CAPSULE BY MOUTH IN THE MORNING   . [DISCONTINUED] STRATTERA 80 MG capsule TAKE ONE CAPSULE BY MOUTH IN THE MORNING   . cetirizine (ZYRTEC) 10 MG tablet TAKE  1 TABLET (10 MG TOTAL) BY MOUTH DAILY. (Patient taking differently: Take 10 mg by mouth daily. TAKE 1 TABLET (10 MG TOTAL) BY MOUTH DAILY.)   . montelukast (SINGULAIR) 10 MG tablet Take 1 tablet (10 mg total) by mouth daily.   . [DISCONTINUED] STRATTERA 25 MG capsule Take 1 capsule (25 mg total) by mouth daily with lunch. (Patient not taking: Reported on 02/12/2019)    No facility-administered encounter medications on file as of 02/12/2019.    Medication Side Effects: None  MENTAL HEALTH: Mental Health Issues:   Anxiety-counseling every other week up until recently. Strattera with good symptom control.  DIAGNOSES:    ICD-10-CM   1. Attention deficit hyperactivity disorder (ADHD), predominantly inattentive type F90.0   2. 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 Raynaud's phenomenon without gangrene I73.00   8. 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 Other cerebral palsy ([place]) G80.8   10. Mild intellectual disabilities F70   11. Medication management Z79.899   12. Patient counseled Z71.9   13. Cerebral seizure I67.89   14. 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 Attention deficit hyperactivity disorder (ADHD), combined type F90.2 STRATTERA 80 MG capsule    RECOMMENDATIONS:  Discussed recent history and updates reviewed with patient & parent for health and school.   Discussed school academic progress and appropriate accommodations as needed for learning support.   Discussed continued need for routine, structure, motivation, reward and positive reinforcement for school and family structure with recent changes since COVID-19 restrictions.   Encouraged recommended limitations on TV, tablets, phones, video games and computers for non-educational activities.    Encouraged physical activity and outdoor play, maintaining social distancing.   Discussed how to talk to anxious children about coronavirus.   Referred to ADDitudemag.com for resources about engaging children who are at home in home and online study.    Counseled medication pharmacokinetics, options, dosage, administration, desired effects, and possible side effects.   Strattera 80 mg daily, # 30 with 2 RF's RX for above e-scribed and sent to pharmacy on record  [id number] [place], [address] [place] [address] Phone: [phone] Fax: [phone]  I discussed the assessment and treatment plan with the patient & parent. The patient & parent was provided an opportunity to ask questions and all were answered. The patient & parent agreed with the plan and demonstrated an understanding of the instructions.   I provided 40 minutes of non-face-to-face time during this encounter. Record review for 10 minutes prior to the virtual visit.   NEXT APPOINTMENT:  Return in about 3 months (around 05/14/2019) for follow up visit.  The patient & parent was advised to call back or seek an in-person evaluation if the symptoms worsen or if the condition fails to improve as anticipated.  Medical Decision-making: More than 50% of the appointment was spent  counseling and discussing diagnosis and management of symptoms with the patient and family.  Carolann Littler, NP

## 2019-02-17 ENCOUNTER — Encounter: Payer: Self-pay | Admitting: Pediatrics

## 2019-02-17 ENCOUNTER — Other Ambulatory Visit: Payer: Self-pay

## 2019-02-17 ENCOUNTER — Ambulatory Visit (INDEPENDENT_AMBULATORY_CARE_PROVIDER_SITE_OTHER): Payer: Medicaid Other | Admitting: Pediatrics

## 2019-02-17 VITALS — Wt 150.0 lb

## 2019-02-17 DIAGNOSIS — L0100 Impetigo, unspecified: Secondary | ICD-10-CM

## 2019-02-17 MED ORDER — MUPIROCIN 2 % EX OINT: TOPICAL_OINTMENT | CUTANEOUS | 2 refills | Status: AC

## 2019-02-17 MED ORDER — CLINDAMYCIN HCL 300 MG PO CAPS: 300.0000 mg | ORAL_CAPSULE | Freq: Three times a day (TID) | ORAL | 0 refills | Status: AC

## 2019-02-17 NOTE — Patient Instructions (Signed)
Impetigo, Adult Impetigo is an infection of the skin. It commonly occurs in young children, but it can also occur in adults. The infection causes itchy blisters and sores that produce brownish-yellow fluid. As the fluid dries, it forms a thick, honey-colored crust. These skin changes usually occur on the face, but they can also affect other areas of the body. Impetigo usually goes away in 7-10 days with treatment. What are the causes? This condition is caused by two types of bacteria. It may be caused by staphylococci or streptococci bacteria. These bacteria cause impetigo when they get under the surface of the skin. This often happens after some damage to the skin, such as:  Cuts, scrapes, or scratches.  Rashes.  Insect bites, especially when you scratch the area of a bite.  Chickenpox or other illnesses that cause open skin sores.  Nail biting or chewing. Impetigo can spread easily from one person to another (is contagious). It may be spread through close skin contact or by sharing towels, clothing, or other items that an infected person has touched. What increases the risk? The following factors may make you more likely to develop this condition:  Playing sports that include skin-to-skin contact with others.  Having a skin condition with open sores, such as chickenpox.  Having diabetes.  Having a weak body defense system (immune system).  Having many skin cuts or scrapes.  Living in an area that has high humidity levels.  Having poor hygiene.  Having high levels of staphylococci in your nose. What are the signs or symptoms? The main symptom of this condition is small blisters, often on the face around the mouth and nose. In time, the blisters break open and turn into tiny sores (lesions) with a yellow crust. In some cases, the blisters cause itching or burning. With scratching, irritation, or lack of treatment, these small lesions may get larger. Other possible symptoms include:   Larger blisters.  Pus.  Swollen lymph glands. Scratching the affected area can cause impetigo to spread to other parts of the body. The bacteria can get under the fingernails and spread when you touch another area of your skin. How is this diagnosed? This condition is usually diagnosed during a physical exam. A skin sample or a sample of fluid from a blister may be taken for lab tests that involve growing bacteria (culture test). Lab tests can help to confirm the diagnosis or help to determine the best treatment. How is this treated? Treatment for this condition depends on the severity of the condition:  Mild impetigo can be treated with prescription antibiotic cream.  Oral antibiotic medicine may be used in more severe cases.  Medicines that reduce itchiness (antihistamines)may also be used. Follow these instructions at home: Medicines  Take over-the-counter and prescription medicines only as told by your health care provider.  Apply or take your antibiotic as told by your health care provider. Do not stop using the antibiotic even if your condition improves. General instructions   To help prevent impetigo from spreading to other body areas: ? Keep your fingernails short and clean. ? Do not scratch the blisters or sores. ? Cover infected areas, if necessary, to keep from scratching. ? Wash your hands often with soap and warm water.  Before applying antibiotic cream or ointment, you should: ? Gently wash the infected areas with antibacterial soap and warm water. ? Soak crusted areas in warm, soapy water using antibacterial soap. ? Gently rub the areas to remove crusts. Do not  areas in warm, soapy water using antibacterial soap.  Gently rub the areas to remove crusts. Do not scrub.  Do not share towels.  Wash your clothing and bedsheets in warm water that is 140F (60C) or warmer.  Stay home until you have used an antibiotic cream for 48 hours (2 days) or an oral antibiotic medicine for 24 hours (1 day). You should only return to work and activities with other people if your skin shows significant improvement.  You may  return to contact sports after you have used antibiotic medicine for 72 hours (3 days).  Keep all follow-up visits as told by your health care provider. This is important.  How is this prevented?  Wash your hands often with soap and warm water.  Do not share towels, washcloths, clothing, bedding, or razors.  Keep your fingernails short.  Keep any cuts, scrapes, bug bites, or rashes clean and covered.  Use insect repellent to prevent bug bites.  Contact a health care provider if:  You develop more blisters or sores even with treatment.  Other family members get sores.  Your skin sores are not improving after 72 hours (3 days) of treatment.  You have a fever.  Get help right away if:  You see spreading redness or swelling of the skin around your sores.  You see red streaks coming from your sores.  You develop a sore throat.  The area around your rash becomes warm, red, or tender to the touch.  You have dark, reddish-brown urine.  You do not urinate often or you urinate small amounts.  You are very tired (lethargic).  You have swelling in the face, hands, or feet.  Summary  Impetigo is a skin infection that causes itchy blisters and sores that produce brownish-yellow fluid. As the fluid dries, it forms a crust.  This condition is caused by staphylococci or streptococci bacteria. These bacteria cause impetigo when they get under the surface of the skin, such as through cuts, rashes, bug bites, or open sores.  Treatment for this condition may include antibiotic ointment or oral antibiotics.  To help prevent impetigo from spreading to other body areas, make sure you keep your fingernails short, avoid scratching, cover any blisters, and wash your hands often.  If you have impetigo, stay home until you have used an antibiotic cream for 48 hours (2 days) or an oral antibiotic medicine for 24 hours (1 day). You should only return to work and activities with other people if your skin shows significant improvement.  This  information is not intended to replace advice given to you by your health care provider. Make sure you discuss any questions you have with your health care provider.  Document Released: 11/06/2014 Document Revised: 11/07/2016 Document Reviewed: 11/07/2016  Elsevier Interactive Patient Education  2019 [employer]vier Inc[employer]

## 2019-02-17 NOTE — Progress Notes (Signed)
Virtual Visit via Telephone Note  I connected with Joe Alvarez on 02/17/19 at 12:45 PM EDT by telephone and verified that I am speaking with the correct person using two identifiers.   I discussed the limitations, risks, security and privacy concerns of performing an evaluation and management service by telephone and the availability of in person appointments. I also discussed with the patient that there may be a patient responsible charge related to this service. The patient expressed understanding and agreed to proceed.   History of Present Illness:  Mom and dad say he has a swollen area to the right cheek for a few days now. May be a cyst but is slightly red and came up a few days ago. No fever and no discharge.   Review of Systems  Constitutional: Negative.  Negative for fever, activity change and appetite change.  HENT: Negative.  Negative for ear pain, congestion and rhinorrhea.   Eyes: Negative.   Respiratory: Negative.  Negative for cough and wheezing.   Cardiovascular: Negative.   Gastrointestinal: Negative.   Musculoskeletal: Negative.  Negative for myalgias, joint swelling and gait problem.  Neurological: Negative for numbness.  Hematological: Negative for adenopathy. Does not bruise/bleed easily.      Observations/Objective:  Likely infected skin but may be a cyst--  Assessment and Plan: will treat with antibiotics and if not resolved will refer to surgeon.   Follow Up Instructions: If not resolved after treatment call for surgical referral    I discussed the assessment and treatment plan with the patient. The patient was provided an opportunity to ask questions and all were answered. The patient agreed with the plan and demonstrated an understanding of the instructions.   The patient was advised to call back or seek an in-person evaluation if the symptoms worsen or if the condition fails to improve as anticipated.  I provided 15 minutes of non-face-to-face time  during this encounter.   Marcha Solders, MD

## 2019-02-26 ENCOUNTER — Telehealth: Payer: Self-pay | Admitting: Pediatrics

## 2019-02-26 DIAGNOSIS — D369 Benign neoplasm, unspecified site: Secondary | ICD-10-CM

## 2019-02-26 NOTE — Telephone Encounter (Signed)
Mother states she was to call you if child still had "the place"'still on his face and it is still there

## 2019-02-27 NOTE — Telephone Encounter (Signed)
Spoke to mom and would refer to peds surgery for management of bump to face.

## 2019-03-05 NOTE — Telephone Encounter (Signed)
Spoke with a [employer] and they recommended a Psychiatric nurse since the cyst is on the face. Referred to Plastic Surgery Dr. Marla Roe for evaluation.

## 2019-03-05 NOTE — Addendum Note (Signed)
Addended by: Gari Crown on: 03/05/2019 09:53 AM   Modules accepted: Orders

## 2019-03-06 ENCOUNTER — Encounter: Payer: Self-pay | Admitting: Plastic Surgery

## 2019-03-06 ENCOUNTER — Other Ambulatory Visit: Payer: Self-pay

## 2019-03-06 ENCOUNTER — Ambulatory Visit (INDEPENDENT_AMBULATORY_CARE_PROVIDER_SITE_OTHER): Payer: Medicaid Other | Admitting: Plastic Surgery

## 2019-03-06 VITALS — BP 113/75 | HR 86 | Temp 97.5°F | Ht 65.0 in | Wt 149.2 lb

## 2019-03-06 DIAGNOSIS — L723 Sebaceous cyst: Secondary | ICD-10-CM

## 2019-03-06 DIAGNOSIS — G808 Other cerebral palsy: Secondary | ICD-10-CM

## 2019-03-06 DIAGNOSIS — Q998 Other specified chromosome abnormalities: Secondary | ICD-10-CM

## 2019-03-06 NOTE — Progress Notes (Signed)
Patient ID: Joe Alvarez, male    DOB: [birth year], 18 y.o.   MRN: [id number]   Chief Complaint  Patient presents with  . Skin Problem    The patient is a 18 year old male here with mom for evaluation of a mass on his left cheek.  Mom says it has been there for at least a month.  There is no trauma to the area known.  He has multiple medical conditions including cerebral palsy and chromosome anomalies.  The mass is under the skin at the left cheek area preauricular.  It is 2 cm in size and firm.  There does not appear to be any infection.  There is no history of drainage and no prior surgery.   Review of Systems  Constitutional: Negative for activity change and appetite change.  HENT: Negative.   Eyes: Negative.   Respiratory: Negative.  Negative for chest tightness and shortness of breath.   Cardiovascular: Negative.   Gastrointestinal: Negative.   Genitourinary: Negative.   Musculoskeletal: Negative.   Skin: Negative for color change and wound.  Hematological: Negative.   Psychiatric/Behavioral: Negative.     Past Medical History:  Diagnosis Date  . ADHD (attention deficit hyperactivity disorder)   . 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 Asthma   . Cerebral palsy ([place])   . 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 Mild mental retardation   . Raynaud's syndrome   . Seizures ([place])    complex partial seizures  . Strabismus     Past Surgical History:  Procedure Laterality Date  . ADENOIDECTOMY  2004  . cystic removed Right    on ear 11/16/2008 and 07/23/2009  . MYRINGOTOMY WITH TUBE PLACEMENT     2002, 2004. 2007  . saliva glands     front saliva glands removed and saliva glands in cheeks relocated further back into throat  . STRABISMUS SURGERY Bilateral    multiple revisions02001,2003,2006,2008, jan 2011, june 2011  . TONSILLECTOMY  2004      Current Outpatient Medications:  .  docusate sodium (STOOL  SOFTENER) 100 MG capsule, Take 100 mg by mouth., Disp: , Rfl:  .  levETIRAcetam (KEPPRA) 500 MG tablet, 1 and 1/2 in am, 2 at night, Disp: , Rfl:  .  Multiple Vitamin (MULTIVITAMIN) capsule, Take by mouth., Disp: , Rfl:  .  Oxcarbazepine (TRILEPTAL) 300 MG tablet, TAKE 1 TABLET BY MOUTH TWICE A DAY, Disp: , Rfl:  .  pyridOXINE (VITAMIN B-6) 100 MG tablet, Take 100 mg by mouth 2 (two) times daily., Disp: , Rfl:  .  STRATTERA 80 MG capsule, TAKE ONE CAPSULE BY MOUTH IN THE MORNING, Disp: 30 capsule, Rfl: 2 .  cetirizine (ZYRTEC) 10 MG tablet, TAKE 1 TABLET (10 MG TOTAL) BY MOUTH DAILY. (Patient taking differently: Take 10 mg by mouth daily. TAKE 1 TABLET (10 MG TOTAL) BY MOUTH DAILY.), Disp: 30 tablet, Rfl: 12 .  montelukast (SINGULAIR) 10 MG tablet, Take 1 tablet (10 mg total) by mouth daily., Disp: 30 tablet, Rfl: 12   Objective:   Vitals:   03/06/19 1134  BP: 113/75  Pulse: 86  Temp: (!) 97.5 F (36.4 C)  SpO2: 99%    Physical Exam Vitals signs and nursing note reviewed.  Constitutional:      Appearance: Normal appearance.  HENT:     Head: Normocephalic and atraumatic.      Nose: Nose  normal.     Mouth/Throat:     Mouth: Mucous membranes are moist.  Cardiovascular:     Rate and Rhythm: Normal rate.  Pulmonary:     Effort: Pulmonary effort is normal. No respiratory distress.  Abdominal:     General: Abdomen is flat. There is no distension.     Tenderness: There is no abdominal tenderness.  Skin:    General: Skin is warm.  Neurological:     General: No focal deficit present.     Mental Status: He is alert.  Psychiatric:        Mood and Affect: Mood normal.        Behavior: Behavior normal.     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  We can do this in the office.  Pictures taken and placed in chart with patient and mom's permission.  [place], DO

## 2019-03-11 ENCOUNTER — Telehealth: Payer: Self-pay | Admitting: Plastic Surgery

## 2019-03-11 NOTE — Telephone Encounter (Signed)
Called patient to confirm appointment scheduled for tomorrow. Patient's mother answered the following questions: 1.Has the patient traveled outside of the state of [place] at all within the past 6 weeks? No 2.Does the patient have a fever or cough at all? No 3.Has the patient been tested for COVID? Had a positive COVID test? No 4. Has the patient been in contact with anyone who has tested positive? No

## 2019-03-12 ENCOUNTER — Other Ambulatory Visit (HOSPITAL_COMMUNITY)
Admission: RE | Admit: 2019-03-12 | Discharge: 2019-03-12 | Disposition: A | Discharge status: Home / Self Care | Payer: Medicaid Other | Source: Ambulatory Visit | Attending: Plastic Surgery | Admitting: Plastic Surgery

## 2019-03-12 ENCOUNTER — Other Ambulatory Visit: Payer: Self-pay

## 2019-03-12 ENCOUNTER — Encounter: Payer: Self-pay | Admitting: Plastic Surgery

## 2019-03-12 ENCOUNTER — Ambulatory Visit (INDEPENDENT_AMBULATORY_CARE_PROVIDER_SITE_OTHER): Payer: Medicaid Other | Admitting: Plastic Surgery

## 2019-03-12 VITALS — BP 118/76 | HR 88 | Temp 97.6°F | Ht 65.0 in | Wt 149.0 lb

## 2019-03-12 DIAGNOSIS — L723 Sebaceous cyst: Secondary | ICD-10-CM

## 2019-03-12 NOTE — Addendum Note (Signed)
Addended by: Wallace Going on: 03/12/2019 04:21 PM   Modules accepted: Orders

## 2019-03-12 NOTE — Progress Notes (Signed)
Preoperative Dx: left cheek sebaceous cyst  Postoperative Dx: Same  Procedure: excision of left cheek sebaceous cyst 1.5 cm  Surgeon: Dr. Lyndee Leo Cadarius Nevares  Anesthesia: Lidocaine 1% with 1:100,000 epinepherine  Description of Procedure: Risks and complications were explained to the patient and mom.  Consent was confirmed.  Time out was called and all information was confirmed to be correct.  The area was prepped with betadine and drapped.  Lidocaine 1% with epinepherine was injected in the subcutaneous area.  After waiting several minutes for the lidocaine to take affect a #15 blade was used to incise the skin over the area.  The small tissue scissors were used to release the 1.5 cm cyst in total and excise it.  The contents appeared like a typical cyst.  A 6-0 Monocryl was used to close the skin edges.  The patient is to follow up in one week.  He tolerated the procedure well and there were no complications. The specimen was sent to pathology/

## 2019-03-13 ENCOUNTER — Telehealth: Payer: Self-pay

## 2019-03-13 NOTE — Telephone Encounter (Signed)
Call to pts mom to assess how pt is doing  Following his procedure yesterday Per his mom- he has had no complications she reports : no bleeding, sutures are intact, no n/v, & no seizure activity noted  Mom is reminded to call for any concerns or questions [place]

## 2019-03-17 ENCOUNTER — Other Ambulatory Visit: Payer: Self-pay

## 2019-03-17 DIAGNOSIS — F902 Attention-deficit hyperactivity disorder, combined type: Secondary | ICD-10-CM

## 2019-03-17 MED ORDER — STRATTERA 80 MG PO CAPS: ORAL_CAPSULE | ORAL | 2 refills | Status: DC

## 2019-03-17 NOTE — Telephone Encounter (Signed)
E-Prescribed Strattera 80 mg DAW directly to  [id number] [place] [place] - [address] [place] [address] Phone: [phone] Fax: [phone]

## 2019-03-17 NOTE — Telephone Encounter (Signed)
Mom called in for refill for Strattera. Last visit 02/12/2019 next visit 05/07/2019

## 2019-03-20 ENCOUNTER — Telehealth: Payer: Self-pay | Admitting: Plastic Surgery

## 2019-03-20 NOTE — Telephone Encounter (Signed)
Called patient to confirm appointment scheduled for tomorrow. Patient's mother answered the following questions: 1. To the best of your knowledge, have you been in close contact with any one with a confirmed diagnosis of COVID-19? No 2. Have you had any one or more of the following; fever, chills, cough, shortness of breath, or any flu-like symptoms? No 3. Have you been diagnosed with or have a previous diagnosis of COVID 19? No 4. I am going to go over a few other symptoms with you. Please let me know if you are experiencing any of the following: None of the below a. Ear, nose, or throat discomfort b. A sore throat c. Headache d. Muscle pain e. Diarrhea f. Loss of taste or smell

## 2019-03-21 ENCOUNTER — Encounter: Payer: Self-pay | Admitting: Plastic Surgery

## 2019-03-21 ENCOUNTER — Ambulatory Visit (INDEPENDENT_AMBULATORY_CARE_PROVIDER_SITE_OTHER): Payer: Medicaid Other | Admitting: Plastic Surgery

## 2019-03-21 ENCOUNTER — Other Ambulatory Visit: Payer: Self-pay

## 2019-03-21 VITALS — BP 109/71 | HR 70 | Temp 97.6°F | Ht 65.0 in | Wt 149.0 lb

## 2019-03-21 DIAGNOSIS — D239 Other benign neoplasm of skin, unspecified: Secondary | ICD-10-CM

## 2019-03-21 NOTE — Progress Notes (Signed)
The pathology showed a pilomatricoma the patient is a 18 year old male here for follow-up after undergoing excision of a cyst on his left cheek.  The area is healing very nicely.  There is no sign of infection.  He and mom are pleased with the results. The path showed a pilomatricoma.  Follow-up as needed.

## 2019-05-07 ENCOUNTER — Ambulatory Visit (INDEPENDENT_AMBULATORY_CARE_PROVIDER_SITE_OTHER): Payer: Medicaid Other | Admitting: Family

## 2019-05-07 ENCOUNTER — Encounter: Payer: Self-pay | Admitting: Family

## 2019-05-07 DIAGNOSIS — G40909 Epilepsy, unspecified, not intractable, without status epilepticus: Secondary | ICD-10-CM

## 2019-05-07 DIAGNOSIS — F79 Unspecified intellectual disabilities: Secondary | ICD-10-CM

## 2019-05-07 DIAGNOSIS — Q998 Other specified chromosome abnormalities: Secondary | ICD-10-CM

## 2019-05-07 DIAGNOSIS — H509 Unspecified strabismus: Secondary | ICD-10-CM

## 2019-05-07 DIAGNOSIS — I6789 Other cerebrovascular disease: Secondary | ICD-10-CM | POA: Diagnosis not present

## 2019-05-07 DIAGNOSIS — H5010 Unspecified exotropia: Secondary | ICD-10-CM

## 2019-05-07 DIAGNOSIS — I7300 Raynaud's syndrome without gangrene: Secondary | ICD-10-CM | POA: Diagnosis not present

## 2019-05-07 DIAGNOSIS — Q9388 Other microdeletions: Secondary | ICD-10-CM

## 2019-05-07 DIAGNOSIS — Z79899 Other long term (current) drug therapy: Secondary | ICD-10-CM

## 2019-05-07 DIAGNOSIS — Q9989 Other specified chromosome abnormalities: Secondary | ICD-10-CM

## 2019-05-07 DIAGNOSIS — H47393 Other disorders of optic disc, bilateral: Secondary | ICD-10-CM

## 2019-05-07 DIAGNOSIS — H1830 Unspecified corneal membrane change: Secondary | ICD-10-CM

## 2019-05-07 DIAGNOSIS — Z719 Counseling, unspecified: Secondary | ICD-10-CM

## 2019-05-07 DIAGNOSIS — F900 Attention-deficit hyperactivity disorder, predominantly inattentive type: Secondary | ICD-10-CM | POA: Diagnosis not present

## 2019-05-07 DIAGNOSIS — H518 Other specified disorders of binocular movement: Secondary | ICD-10-CM | POA: Diagnosis not present

## 2019-05-07 DIAGNOSIS — Q100 Congenital ptosis: Secondary | ICD-10-CM

## 2019-05-07 NOTE — Progress Notes (Signed)
[facility]. [address] [place] [address] Dept: [phone] Dept Fax: [phone]  Medication Check visit via Virtual Video due to COVID-19  Patient ID:  Joe Alvarez  male DOB: 2000/12/02   18 y.o.   MRN: [id number]   DATE:05/08/19  PCP: Marcha Solders, MD  Virtual Visit via Video Note  I connected with  Joe Alvarez  and Joe Alvarez 's Mother (Name [name]) on 05/08/19 at  3:00 PM EDT by a video enabled telemedicine application and verified that I am speaking with the correct person using two identifiers. Patient & Parent Location: at home   I discussed the limitations, risks, security and privacy concerns of performing an evaluation and management service by telephone and the availability of in person appointments. I also discussed with the parents that there may be a patient responsible charge related to this service. The parents expressed understanding and agreed to proceed.  Provider: Carolann Littler, NP  Location: private location  HISTORY/CURRENT STATUS: Joe Alvarez is here for medication management of the psychoactive medications for ADHD and review of educational and behavioral concerns.   Joe Alvarez currently taking Strattera 80 mg daily, which is working well. Takes medication in the morning. Medication tends to last all day. Joe Alvarez is able to focus through school/home/work.   Joe Alvarez is eating well (eating breakfast, lunch and dinner). None reported recently  Sleeping well (trouble with falling asleep), sleeping through the night once asleep.   EDUCATION: School: [employer] for community college with [employer]	[place] [employer] Year/Grade: college  Performance/ Grades: average Services: IEP/504 Plan, Resource/Inclusion and Other: Help as needed  Joe Alvarez was out of school due to social distancing due to COVID-19 and participated in a home schooling program.   Activities/ Exercise:  intermittently some activity outside and Wii video games  Screen time: (phone, tablet, TV, computer): TV and online.  MEDICAL HISTORY: Individual Medical History/ Review of Systems: Changes? :Yes, saw plastic surgery visit with removal of sebaceous cyst in May. No other issues. Video visit with Dr. Sheppard Coil in May with no changes for epilespy telemedicine visit and will have EEG completed soon. Spoke with Dr. Rip Harbour   Family Medical/ Social History: Changes? None reported by mother or patient Patient Lives with: mother and sibling  Current Medications:  Current Outpatient Medications on File Prior to Visit  Medication Sig Dispense Refill  . docusate sodium (STOOL SOFTENER) 100 MG capsule Take 100 mg by mouth.    . levETIRAcetam (KEPPRA) 500 MG tablet 1 and 1/2 in am, 2 at night    . Multiple Vitamin (MULTIVITAMIN) capsule Take by mouth.    . Oxcarbazepine (TRILEPTAL) 300 MG tablet TAKE 1 TABLET BY MOUTH TWICE A DAY    . pyridOXINE (VITAMIN B-6) 100 MG tablet Take 100 mg by mouth 2 (two) times daily.    Joe Alvarez Kitchen STRATTERA 80 MG capsule TAKE ONE CAPSULE BY MOUTH IN THE MORNING 30 capsule 2  . cetirizine (ZYRTEC) 10 MG tablet TAKE 1 TABLET (10 MG TOTAL) BY MOUTH DAILY. (Patient taking differently: Take 10 mg by mouth daily. TAKE 1 TABLET (10 MG TOTAL) BY MOUTH DAILY.) 30 tablet 12  . montelukast (SINGULAIR) 10 MG tablet Take 1 tablet (10 mg total) by mouth daily. 30 tablet 12   No current facility-administered medications on file prior to visit.    Medication Side Effects: None  MENTAL HEALTH: Mental Health Issues:   None recently reported  DIAGNOSES:    ICD-10-CM   1. Attention deficit hyperactivity disorder (ADHD), predominantly inattentive type  F90.0   2. Raynaud's disease without gangrene  I73.00   3. Cerebral seizure  I67.89   4. 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 Intellectual disability  F79   11. Strabismus  H50.9   12. 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 Chromosomal microdeletion syndrome  Q93.88   14. 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 Patient counseled  Z71.9   16. 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   Discussed school academic progress and recommended continued  appropriate accommodations for learning support.   Referred to [employer] for resources about engaging children who are home for the summer with ADHD.   Recommended summer reading program. Referred to [employer] ([email or web address])  Discussed continued need for routine, structure, motivation, reward and positive reinforcement with updates since last visit   Encouraged recommended limitations on TV, tablets, phones, video games and computers for non-educational activities.   Discussed need for bedtime routine, use of good sleep hygiene, no video games, TV or phones for an hour before bedtime.   Encouraged physical activity and outdoor play, maintaining social distancing.   Counseled medication pharmacokinetics, options, dosage, administration, desired effects, and possible side effects.   Strattera 80 mg with no Rx today.  I discussed the assessment and treatment plan with the patient & parent. The patient & parent was provided an opportunity to ask questions and all were answered. The patient & parent agreed with the plan and demonstrated an understanding of the instructions.   I provided 40 minutes of non-face-to-face time during this encounter. Completed record review for 10 minutes prior to the virtual video visit.   NEXT APPOINTMENT:  Return in about 3 months  (around 08/07/2019) for follow up visit .  The patient & parent was advised to call back or seek an in-person evaluation if the symptoms worsen or if the condition fails to improve as anticipated.  Medical Decision-making: More than 50% of the appointment was spent counseling and discussing diagnosis and management of symptoms with the patient and family.  Carolann Littler, NP

## 2019-05-08 ENCOUNTER — Encounter: Payer: Self-pay | Admitting: Family

## 2019-07-07 ENCOUNTER — Other Ambulatory Visit: Payer: Self-pay | Admitting: Pediatrics

## 2019-07-07 DIAGNOSIS — F902 Attention-deficit hyperactivity disorder, combined type: Secondary | ICD-10-CM

## 2019-07-08 NOTE — Telephone Encounter (Signed)
Strattera 80 mg daily, DAW, # 30 with 2 RF's RX for above e-scribed and sent to pharmacy on record  [id number] [place], [address] [place] [address] Phone: [phone] Fax: [phone]

## 2019-07-09 NOTE — Telephone Encounter (Signed)
Mom called in stating that Strattera RX needs to be faxed to the pharm [phone]

## 2019-07-09 NOTE — Addendum Note (Signed)
Addended by: Venetia Maxon on: 07/09/2019 03:55 PM   Modules accepted: Orders

## 2019-07-10 MED ORDER — STRATTERA 80 MG PO CAPS: ORAL_CAPSULE | ORAL | 2 refills | Status: DC

## 2019-07-10 NOTE — Addendum Note (Signed)
Addended by: Carolann Littler on: 07/10/2019 07:35 AM   Modules accepted: Orders

## 2019-07-10 NOTE — Telephone Encounter (Signed)
Strattera 80 mg daily, # 30 with 2 RF's DAW, needed to have printed & signed for insurance with faxing Rx to pharmacy for refill. RX for above e-scribed and sent to pharmacy on record  [id number] [place], [place] [address] [place] [address] Phone: [phone] Fax: [phone]

## 2019-08-07 ENCOUNTER — Ambulatory Visit (INDEPENDENT_AMBULATORY_CARE_PROVIDER_SITE_OTHER): Payer: Medicaid Other | Admitting: Family

## 2019-08-07 ENCOUNTER — Other Ambulatory Visit: Payer: Self-pay

## 2019-08-07 ENCOUNTER — Encounter: Payer: Self-pay | Admitting: Family

## 2019-08-07 VITALS — BP 102/68 | HR 76 | Resp 16 | Ht 65.0 in | Wt 154.0 lb

## 2019-08-07 DIAGNOSIS — H47393 Other disorders of optic disc, bilateral: Secondary | ICD-10-CM

## 2019-08-07 DIAGNOSIS — Q998 Other specified chromosome abnormalities: Secondary | ICD-10-CM

## 2019-08-07 DIAGNOSIS — I7300 Raynaud's syndrome without gangrene: Secondary | ICD-10-CM

## 2019-08-07 DIAGNOSIS — H1830 Unspecified corneal membrane change: Secondary | ICD-10-CM

## 2019-08-07 DIAGNOSIS — Q9388 Other microdeletions: Secondary | ICD-10-CM | POA: Diagnosis not present

## 2019-08-07 DIAGNOSIS — Q100 Congenital ptosis: Secondary | ICD-10-CM

## 2019-08-07 DIAGNOSIS — Z7189 Other specified counseling: Secondary | ICD-10-CM

## 2019-08-07 DIAGNOSIS — H518 Other specified disorders of binocular movement: Secondary | ICD-10-CM

## 2019-08-07 DIAGNOSIS — F900 Attention-deficit hyperactivity disorder, predominantly inattentive type: Secondary | ICD-10-CM

## 2019-08-07 DIAGNOSIS — G40909 Epilepsy, unspecified, not intractable, without status epilepticus: Secondary | ICD-10-CM

## 2019-08-07 DIAGNOSIS — M2170 Unequal limb length (acquired), unspecified site: Secondary | ICD-10-CM

## 2019-08-07 DIAGNOSIS — H5010 Unspecified exotropia: Secondary | ICD-10-CM

## 2019-08-07 DIAGNOSIS — H509 Unspecified strabismus: Secondary | ICD-10-CM

## 2019-08-07 DIAGNOSIS — F79 Unspecified intellectual disabilities: Secondary | ICD-10-CM

## 2019-08-07 DIAGNOSIS — Z79899 Other long term (current) drug therapy: Secondary | ICD-10-CM

## 2019-08-07 NOTE — Progress Notes (Signed)
Patient ID: Joe Alvarez, male   DOB: 2001/05/29, 18 y.o.   MRN: [id number] Medical Follow-up  Patient ID: Joe Alvarez  DOB: [id number]  MRN: [id number]  DATE:08/07/19 Marcha Solders, MD  Accompanied by: Mother Patient Lives with: mother, father, and sibling  HISTORY/CURRENT STATUS: HPI Patient here with mother for today's visit. Patient interactive and appropriate with provider with answering questions. Patient reports some difficulties with getting enough hours for his hours required for certification. Looking at job application process and interviewing with coach to help with the process. Patient has continued with Strattera and no side effects.   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 Coaching now to assist with job interview and application process Paper work signed yesterday for [employer] to assist with interviewing and applications  Activities: some, but not too much Screen Time: TV, phone, and movies Driving: not driving  MEDICAL HISTORY: Appetite: Good, eating all day long.  Sleep: Bedtime: 10:00 pm   Awakens: 8-9:00 am  Sleep Concerns: Bored during the day and sleeping most of the day. No issues with sleeping at night.   Allergies:  Allergies  Allergen Reactions  . Atropine Other (See Comments)    Pt can't see lines and colors  . Cyclopentolate Other (See Comments)    Took 3 weeks for eye dilation to resolve  . Tetanus Toxoids Swelling   Current Medications:  Strattera 80 mg daily Medication Side Effects: None  Individual Medical History/Review of System Changes? None reported recently. EEG completed at [facility] last month and viewed clinical notes with normal results. Has PCP visit for the 29th of October.   Family Medical/Social History Changes?: None   MENTAL HEALTH: Mental Health  Issues:  Denies sadness, loneliness or depression. No self harm or thoughts of self harm or injury. Denies fears, worries and anxieties. Has good peer relations and is not a bully nor is victimized.  ROS: Review of Systems  Psychiatric/Behavioral: Positive for decreased concentration. The patient is nervous/anxious.   All other systems reviewed and are negative.  PHYSICAL EXAM: Vitals:   08/07/19 1038  BP: 102/68  Pulse: 76  Resp: 16  Weight: 154 lb (69.9 kg)  Height: 5\' 5"  (1.651 m)   Body mass index is 25.63 kg/m.  General Exam: Physical Exam Vitals signs reviewed.  Constitutional:      Appearance: Normal appearance. He is well-developed and normal weight.  HENT:     Head: Normocephalic and atraumatic.     Right Ear: Tympanic membrane, ear canal and external ear normal.     Left Ear: Tympanic membrane, ear canal and external ear normal.     Nose: Nose normal.     Mouth/Throat:     Mouth: Mucous membranes are moist.  Eyes:     Extraocular Movements: Extraocular movements intact.     Conjunctiva/sclera: Conjunctivae normal.     Pupils: Pupils are equal, round, and reactive to light.  Neck:     Musculoskeletal: Full passive range of motion without pain, normal range of motion and neck supple.     Trachea: Trachea normal.  Cardiovascular:     Rate and Rhythm: Normal rate and regular rhythm.     Pulses: Normal pulses.     Heart sounds: Normal heart sounds.  Pulmonary:     Effort: Pulmonary effort is normal.     Breath sounds:  Normal breath sounds.  Abdominal:     General: Bowel sounds are normal.     Palpations: Abdomen is soft.  Musculoskeletal: Normal range of motion.  Skin:    General: Skin is warm and dry.     Capillary Refill: Capillary refill takes less than 2 seconds.  Neurological:     Mental Status: He is alert and oriented to person, place, and time. Mental status is at baseline.     Deep Tendon Reflexes: Reflexes are normal and symmetric.   Psychiatric:        Behavior: Behavior normal.        Thought Content: Thought content normal.        Judgment: Judgment normal.   Neurological: oriented to time, place, and person  Testing/Developmental Screens:  not completed today Reviewed with patient and mother for continued concerns with learning.   DIAGNOSES:    ICD-10-CM   1. Attention deficit hyperactivity disorder (ADHD), predominantly inattentive type  F90.0   2. 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 Corneal epithelial defect  H18.30   7. Dissociated vertical deviation  H51.8   8. 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 Intellectual disability  F79   10. Leg length discrepancy  M21.70   11. Optic nerve cupping, bilateral  H47.393   12. 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 Strabismus  H50.9   15. 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 Medication management  Z79.899   17. Goals of care, counseling/discussion  Z71.89    RECOMMENDATIONS:  Counseling at this visit included the review of old records and/or current chart with the patient & parent with updates for schooling, work, application process, health and medication.   Discussed recent history and today's examination with patient & parent with no changes.   Counseled regarding  growth and development with review from last visit, 77 %ile (Z= 0.75) based on CDC (Boys, 2-20 Years) BMI-for-age based on BMI available as of 08/07/2019.  Will continue to monitor.   Recommended a high protein, low sugar diet, watch portion sizes, avoid second helpings, avoid sugary snacks and drinks, drink more water, eat more fruits and vegetables, increase daily exercise.  Discussed school academic and behavioral progress and advocated for appropriate accommodations as needed for work.   Discussed importance of  maintaining structure, routine, organization, reward, motivation and consequences with consistency with home and work settings.   Counseled medication pharmacokinetics, options, dosage, administration, desired effects, and possible side effects.   Strattera 80 mg daily, no Rx today  Advised importance of:  Good sleep hygiene (8- 10 hours per night, no TV or video games for 1 hour before bedtime) Limited screen time (none on school nights, no more than 2 hours/day on weekends, use of screen time for motivation) Regular exercise(outside and active play) Healthy eating (drink water or milk, no sodas/sweet tea, limit portions and no seconds).   Patient and mother verbalized understanding of all topics discussed.  NEXT APPOINTMENT: Return in about 3 months (around 11/07/2019) for follow up visit.  Medical Decision-making: More than 50% of the appointment was spent counseling and discussing diagnosis and management of symptoms with the patient and family.  I discussed the assessment and treatment plan with the parent. The parent was provided an opportunity  to ask questions and all were answered. The parent agreed with the plan and demonstrated an understanding of the instructions.   The parent was advised to call back or seek an in-person evaluation if the symptoms worsen or if the condition fails to improve as anticipated.  Counseling Time: 40 minutes Total Contact Time: 50 minutes

## 2019-08-28 ENCOUNTER — Ambulatory Visit (INDEPENDENT_AMBULATORY_CARE_PROVIDER_SITE_OTHER): Payer: Medicaid Other | Admitting: Pediatrics

## 2019-08-28 ENCOUNTER — Other Ambulatory Visit: Payer: Self-pay

## 2019-08-28 ENCOUNTER — Encounter: Payer: Self-pay | Admitting: Pediatrics

## 2019-08-28 VITALS — BP 118/78 | Ht 65.0 in | Wt 154.2 lb

## 2019-08-28 DIAGNOSIS — Z68.41 Body mass index (BMI) pediatric, 5th percentile to less than 85th percentile for age: Secondary | ICD-10-CM

## 2019-08-28 DIAGNOSIS — Q9388 Other microdeletions: Secondary | ICD-10-CM | POA: Diagnosis not present

## 2019-08-28 DIAGNOSIS — Z23 Encounter for immunization: Secondary | ICD-10-CM

## 2019-08-28 DIAGNOSIS — Z00121 Encounter for routine child health examination with abnormal findings: Secondary | ICD-10-CM | POA: Insufficient documentation

## 2019-08-28 DIAGNOSIS — Z0001 Encounter for general adult medical examination with abnormal findings: Secondary | ICD-10-CM | POA: Diagnosis not present

## 2019-08-28 DIAGNOSIS — G40909 Epilepsy, unspecified, not intractable, without status epilepticus: Secondary | ICD-10-CM

## 2019-08-28 MED ORDER — CETIRIZINE HCL 10 MG PO TABS: 10.0000 mg | ORAL_TABLET | Freq: Every day | ORAL | 12 refills | Status: DC

## 2019-08-28 MED ORDER — MONTELUKAST SODIUM 10 MG PO TABS: 10.0000 mg | ORAL_TABLET | Freq: Every day | ORAL | 12 refills | Status: DC

## 2019-08-28 NOTE — Patient Instructions (Signed)

## 2019-08-28 NOTE — Progress Notes (Signed)
Adolescent Well Care Visit Joe Alvarez is a 18 y.o. male who is here for well care.    PCP:  Marcha Solders, MD   History was provided by the patient and mother.  Confidentiality was discussed with the patient and, if applicable, with caregiver as well.  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  Surgeries Eye surgery-2001-2014 for alignment  TM tubes X 3 2002-2007 T's and A's in 2004 Salivary gland surgery Dental surgery for wisdom teeth removal Right ear cyst removal   Medications: Keppra--1750 mg--750am, 1000pm Trileptal-300 mg BID Strattera-80 mg daily Singulair--10 mg daily Zyrtec 10 mg daily Vitamin B6 and multivitamin daily Stool softener   Nutrition: Nutrition/Eating Behaviors: good Adequate calcium in diet?: yes Supplements/ Vitamins: yes  Exercise/ Media: Play any Sports?/ Exercise: no Screen Time:  < 2 hours Media Rules or Monitoring?: yes  Sleep:  Sleep: good  Social Screening: Lives with:  parents Parental relations:  good Activities, Work, and Programmer, systems Concerns regarding behavior with peers?  no Stressors of note: no  Education:  School Grade: workin   Menstruation:   No LMP for male patient.   Confidential Social History: Tobacco?  no Secondhand smoke exposure?  no Drugs/ETOH?  no  Sexually Active?  no     Safe at home, in school & in relationships?  Yes Safe to self?  Yes   Screenings: Patient has a dental home: yes  The patient completed the Rapid Assessment for Adolescent Preventive Services screening questionnaire and the following topics were identified as risk factors and discussed: healthy eating, exercise, seatbelt use, bullying, abuse/trauma, weapon use, tobacco use, marijuana use, drug use, condom use, birth control, sexuality, suicidality/self  harm, mental health issues, social isolation, school problems, family problems and screen time    PHQ-9 deferred -already followed by psychiatry and counselor.  Physical Exam:  Vitals:   08/28/19 0918  BP: 118/78  Weight: 154 lb 3.2 oz (69.9 kg)  Height: 5' 5"  (1.651 m)   BP 118/78   Ht 5' 5"  (1.651 m)   Wt 154 lb 3.2 oz (69.9 kg)   BMI 25.66 kg/m  Body mass index: body mass index is 25.66 kg/m. Blood pressure percentiles are not available for patients who are 18 years or older.   Hearing Screening   125Hz  250Hz  500Hz  1000Hz  2000Hz  3000Hz  4000Hz  6000Hz  8000Hz   Right ear:   20 20 20 20 20     Left ear:   20 20 20 20 20       Visual Acuity Screening   Right eye Left eye Both eyes  Without correction: 10/10 10/10   With correction:       General Appearance:   alert, oriented, no acute distress and well nourished  HENT: Normocephalic, no obvious abnormality, conjunctiva clear  Mouth:   Normal appearing teeth, no obvious discoloration, dental caries, or dental caps  Neck:   Supple; thyroid: no enlargement, symmetric, no tenderness/mass/nodules  Chest normal  Lungs:   Clear to auscultation bilaterally, normal work of breathing  Heart:   Regular rate and rhythm, S1 and S2 normal, no murmurs;   Abdomen:   Soft, non-tender, no mass, or organomegaly  GU normal male genitals, no testicular masses or hernia  Musculoskeletal:   Tone and strength strong and symmetrical, all extremities  Lymphatic:   No cervical adenopathy  Skin/Hair/Nails:   Skin warm, dry and intact, no rashes, no bruises or petechiae  Neurologic:   Strength, gait, and coordination normal and age-appropriate     Assessment and Plan:   Well young adult  BMI is appropriate for age  Hearing screening result:normal Vision screening result: normal  Counseling provided for all of the vaccine components  Orders Placed This Encounter  Procedures  . Flu Vaccine QUAD 6+ mos PF IM (Fluarix Quad PF)    Indications, contraindications and side effects of vaccine/vaccines discussed with parent and parent verbally expressed understanding and also agreed with the administration of vaccine/vaccines as ordered above today.Handout (VIS) given for each vaccine at this visit.   Return in about 1 year (around 08/27/2020).Marcha Solders, MD

## 2019-09-11 ENCOUNTER — Telehealth: Payer: Self-pay

## 2019-09-11 NOTE — Telephone Encounter (Signed)
Pharm faxed in Prior Auth for Strattera. Last visit 08/07/2019. Submitting Prior Auth to [employer]

## 2019-09-11 NOTE — Telephone Encounter (Signed)
Approval Entry Complete Form HelpConfirmation [id number] Joe Alvarez [id number]

## 2019-11-11 ENCOUNTER — Encounter: Payer: Self-pay | Admitting: Family

## 2019-11-11 ENCOUNTER — Other Ambulatory Visit: Payer: Self-pay

## 2019-11-11 ENCOUNTER — Ambulatory Visit (INDEPENDENT_AMBULATORY_CARE_PROVIDER_SITE_OTHER): Payer: Medicaid Other | Admitting: Family

## 2019-11-11 DIAGNOSIS — H509 Unspecified strabismus: Secondary | ICD-10-CM

## 2019-11-11 DIAGNOSIS — F840 Autistic disorder: Secondary | ICD-10-CM | POA: Diagnosis not present

## 2019-11-11 DIAGNOSIS — M2170 Unequal limb length (acquired), unspecified site: Secondary | ICD-10-CM

## 2019-11-11 DIAGNOSIS — Z87898 Personal history of other specified conditions: Secondary | ICD-10-CM

## 2019-11-11 DIAGNOSIS — Z719 Counseling, unspecified: Secondary | ICD-10-CM

## 2019-11-11 DIAGNOSIS — G40909 Epilepsy, unspecified, not intractable, without status epilepticus: Secondary | ICD-10-CM | POA: Diagnosis not present

## 2019-11-11 DIAGNOSIS — I7300 Raynaud's syndrome without gangrene: Secondary | ICD-10-CM

## 2019-11-11 DIAGNOSIS — H518 Other specified disorders of binocular movement: Secondary | ICD-10-CM

## 2019-11-11 DIAGNOSIS — F411 Generalized anxiety disorder: Secondary | ICD-10-CM

## 2019-11-11 DIAGNOSIS — R278 Other lack of coordination: Secondary | ICD-10-CM

## 2019-11-11 DIAGNOSIS — Q999 Chromosomal abnormality, unspecified: Secondary | ICD-10-CM

## 2019-11-11 DIAGNOSIS — Z7189 Other specified counseling: Secondary | ICD-10-CM

## 2019-11-11 DIAGNOSIS — Q100 Congenital ptosis: Secondary | ICD-10-CM

## 2019-11-11 DIAGNOSIS — Q9388 Other microdeletions: Secondary | ICD-10-CM

## 2019-11-11 DIAGNOSIS — H47393 Other disorders of optic disc, bilateral: Secondary | ICD-10-CM

## 2019-11-11 DIAGNOSIS — F70 Mild intellectual disabilities: Secondary | ICD-10-CM

## 2019-11-11 DIAGNOSIS — F902 Attention-deficit hyperactivity disorder, combined type: Secondary | ICD-10-CM | POA: Diagnosis not present

## 2019-11-11 DIAGNOSIS — Z79899 Other long term (current) drug therapy: Secondary | ICD-10-CM

## 2019-11-11 DIAGNOSIS — H5010 Unspecified exotropia: Secondary | ICD-10-CM

## 2019-11-11 DIAGNOSIS — H4720 Unspecified optic atrophy: Secondary | ICD-10-CM

## 2019-11-11 MED ORDER — STRATTERA 80 MG PO CAPS: ORAL_CAPSULE | ORAL | 2 refills | Status: DC

## 2019-11-11 NOTE — Progress Notes (Signed)
[facility] [address] [place] [address] Dept: [phone] Dept Fax: [phone]  Medication Check visit via Virtual Video due to COVID-19  Patient ID:  Joe Alvarez  male DOB: 05-09-2001   19 y.o.   MRN: [id number]   DATE:11/11/19  PCP: Marcha Solders, MD  Virtual Visit via Video Note  I connected with  Joe Alvarez  and Joe Alvarez 's Mother (Name [name]) on 11/11/19 at  9:00 AM EST by a video enabled telemedicine application and verified that I am speaking with the correct person using two identifiers. Patient/Parent Location: at home   I discussed the limitations, risks, security and privacy concerns of performing an evaluation and management service by telephone and the availability of in person appointments. I also discussed with the parents that there may be a patient responsible charge related to this service. The parents expressed understanding and agreed to proceed.  Provider: Carolann Littler, NP  Location: at office  HISTORY/CURRENT STATUS: Joe Alvarez is here for medication management of the psychoactive medications for ADHD and review of educational and behavioral concerns.   Prodigy currently taking Strattera, which is working well. Takes medication daily at the same time. Medication tends to last for the time intended to each day.  Romer is eating well (eating breakfast, lunch and dinner). Eating with no real issues during the day. Loves to eat snacks, crackers, during the day. Eating a good dinner, some breakfast and minimal lunch.   Sleeping well (gotting plenty of sleep each night), sleeping through the night. Going to bed later and sleeping in daily.   School: [employer] for [employer] with work based learning.  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   Devereaux is currently in distance learning due to social distancing due to COVID-19 and will continue for at least: at least for the last semester at school.    Activities/ Exercise: intermittently  Screen time: (phone, tablet, TV, computer): computer for learning, TV and games.   MEDICAL HISTORY: Individual Medical History/ Review of Systems: Changes? :None reported recently. Seen Dr at [facility] office for inserts, EEG completed with no changes to medications, seen Dr. Laurice Record with flu shot and wellness, and telehealth call with neurology for follow up.   Family Medical/ Social History: Changes? None. Several family members that are outside of the household with COVID-19. Patient Lives with: parents   Current Medications:  Current Outpatient Medications  Medication Instructions  . cetirizine (ZYRTEC) 10 mg, Oral, Daily  . docusate sodium (STOOL SOFTENER) 100 mg, Oral  . levETIRAcetam (KEPPRA) 500 MG tablet 1 and 1/2 in am, 2 at night  . montelukast (SINGULAIR) 10 mg, Oral, Daily  . Multiple Vitamin (MULTIVITAMIN) capsule Oral  . Oxcarbazepine (TRILEPTAL) 300 MG tablet TAKE 1 TABLET BY MOUTH TWICE A DAY  . pyridOXINE (VITAMIN B-6) 100 mg, 2 times daily  . STRATTERA 80 MG capsule TAKE 1 CAPSULE BY MOUTH EVERY DAY IN THE MORNING   Medication Side Effects: None  MENTAL HEALTH: Mental Health Issues:   Anxiety-Strattera 80 mg daily, with good control  DIAGNOSES:    ICD-10-CM   1. Attention deficit hyperactivity disorder (ADHD), combined type  F90.2 STRATTERA 80 MG capsule    DISCONTINUED: STRATTERA  80 MG capsule  2. Autism spectrum disorder  F84.0   3. 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 Dysgraphia  R27.8   6. History of learning disability  Z87.898   7. Generalized anxiety disorder   F41.1   8. Raynaud's disease without gangrene  I73.00   9. 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 Chromosomal abnormality syndrome  Q99.9   11. Leg length discrepancy  M21.70   12. Mild intellectual disability  F70   13. 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 Dissociated vertical deviation  H51.8   17. 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 Strabismus  H50.9   19. Patient counseled  Z71.9   20. Medication management  Z79.899   21. Goals of care, counseling/discussion  Z71.89     RECOMMENDATIONS:  Discussed recent history with patient & parent with updates for learning, job searching, vocational rehab, health and medications.   Discussed school academic progress and recommended continued accommodations for the remainder of the school year.  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  Encouraged recommended limitations on TV, tablets, phones, video games and computers for non-educational activities.   Discussed need for bedtime routine, use of good sleep hygiene, no video games, TV or phones for an hour before bedtime.   Encouraged physical activity and outdoor play, maintaining social distancing.   Counseled medication pharmacokinetics, options, dosage, administration, desired effects, and possible side effects.   Strattera 80 mg daily, # 30 with 2 RF's DAW RX for above e-scribed and sent to pharmacy on record  [id number] - [place], [address] [place] [address] Phone: [phone] Fax: [phone]  I discussed the assessment and treatment plan with the patient & parent. The patient & parent was provided an opportunity to ask questions and all  were answered. The patient & parent agreed with the plan and demonstrated an understanding of the instructions.   I provided 40 minutes of non-face-to-face time during this encounter. Completed record review for 10 minutes prior to the virtual video visit.   NEXT APPOINTMENT:  Return in about 3 months (around 02/09/2020) for follow up visit.  The patient & parent was advised to call back or seek an in-person evaluation if the symptoms worsen or if the condition fails to improve as anticipated.  Medical Decision-making: More than 50% of the appointment was spent counseling and discussing diagnosis and management of symptoms with the patient and family.  Carolann Littler, NP

## 2020-02-09 ENCOUNTER — Encounter: Payer: Self-pay | Admitting: Family

## 2020-02-09 ENCOUNTER — Ambulatory Visit (INDEPENDENT_AMBULATORY_CARE_PROVIDER_SITE_OTHER): Payer: Medicaid Other | Admitting: Family

## 2020-02-09 DIAGNOSIS — Z79899 Other long term (current) drug therapy: Secondary | ICD-10-CM

## 2020-02-09 DIAGNOSIS — R278 Other lack of coordination: Secondary | ICD-10-CM

## 2020-02-09 DIAGNOSIS — Q9388 Other microdeletions: Secondary | ICD-10-CM | POA: Diagnosis not present

## 2020-02-09 DIAGNOSIS — Z719 Counseling, unspecified: Secondary | ICD-10-CM

## 2020-02-09 DIAGNOSIS — Z87898 Personal history of other specified conditions: Secondary | ICD-10-CM

## 2020-02-09 DIAGNOSIS — M2170 Unequal limb length (acquired), unspecified site: Secondary | ICD-10-CM

## 2020-02-09 DIAGNOSIS — F840 Autistic disorder: Secondary | ICD-10-CM | POA: Diagnosis not present

## 2020-02-09 DIAGNOSIS — J4520 Mild intermittent asthma, uncomplicated: Secondary | ICD-10-CM

## 2020-02-09 DIAGNOSIS — Z7189 Other specified counseling: Secondary | ICD-10-CM

## 2020-02-09 DIAGNOSIS — Q100 Congenital ptosis: Secondary | ICD-10-CM

## 2020-02-09 DIAGNOSIS — I7300 Raynaud's syndrome without gangrene: Secondary | ICD-10-CM

## 2020-02-09 DIAGNOSIS — F902 Attention-deficit hyperactivity disorder, combined type: Secondary | ICD-10-CM

## 2020-02-09 DIAGNOSIS — H5010 Unspecified exotropia: Secondary | ICD-10-CM

## 2020-02-09 DIAGNOSIS — F411 Generalized anxiety disorder: Secondary | ICD-10-CM

## 2020-02-09 DIAGNOSIS — F70 Mild intellectual disabilities: Secondary | ICD-10-CM

## 2020-02-09 DIAGNOSIS — H47393 Other disorders of optic disc, bilateral: Secondary | ICD-10-CM

## 2020-02-09 MED ORDER — STRATTERA 80 MG PO CAPS: ORAL_CAPSULE | ORAL | 2 refills | Status: DC

## 2020-02-09 NOTE — Progress Notes (Signed)
[facility] [address] [place] [address] Dept: [phone] Dept Fax: [phone]  Medication Check visit via Virtual Video due to COVID-19  Patient ID:  Joe Alvarez  male DOB: Sep 16, 2001   19 y.o.   MRN: [id number]   DATE:02/09/20  PCP: Marcha Solders, MD  Virtual Visit via Video Note  I connected with  Joe Alvarez  and Joe Alvarez 's Mother (Name Joe Alvarez) on 02/09/20 at 10:00 AM EDT by a video enabled telemedicine application and verified that I am speaking with the correct person using two identifiers. Patient/Parent Location: at home   I discussed the limitations, risks, security and privacy concerns of performing an evaluation and management service by telephone and the availability of in person appointments. I also discussed with the parents that there may be a patient responsible charge related to this service. The parents expressed understanding and agreed to proceed.  Provider: Carolann Littler, NP  Location: private location  HISTORY/CURRENT STATUS: Joe Alvarez is here for medication management of the psychoactive medications for ADHD and review of educational and behavioral concerns.   Joe Alvarez currently taking Strattera, which is working well. Takes medication at the same daily Medication tends to last for the entire time needed. Joe Alvarez is able to focus through work.   Joe Alvarez is eating well (eating breakfast, lunch and dinner). Eating well with mostly snacking.   Sleeping well (getting plenty of sleep each night), sleeping through the night.   EDUCATION/WORK: School: Joe Al[employer] for [employer] with work based Immunologist.  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   [name] is currently in distance learning due to social distancing due to COVID-19 and will continue through: recently.  Activities/ Exercise: intermittently with outside help around the yard.  Screen time: (phone, tablet, TV, computer): computer for learning, phone, TV and games.  MEDICAL HISTORY: Individual Medical History/ Review of Systems: Changes? :None reported recent. 1st COVID-19 vaccine received and has appt on 02/16/20 for 2nd dose.   Family Medical/ Social History: Changes? None Patient Lives with: parents and brother  Current Medications:   Current Outpatient Medications  Medication Instructions  . cetirizine (ZYRTEC) 10 mg, Oral, Daily  . docusate sodium (STOOL SOFTENER) 100 mg, Oral  . levETIRAcetam (KEPPRA) 500 MG tablet 1 and 1/2 in am, 2 at night  . montelukast (SINGULAIR) 10 mg, Oral, Daily  . Multiple Vitamin (MULTIVITAMIN) capsule Oral  . Oxcarbazepine (TRILEPTAL) 300 MG tablet TAKE 1 TABLET BY MOUTH TWICE A DAY  . pyridOXINE (VITAMIN B-6) 100 mg, 2 times daily  . STRATTERA 80 MG capsule TAKE 1 CAPSULE BY MOUTH EVERY DAY IN THE MORNING   Medication Side Effects: None  MENTAL HEALTH: Mental Health Issues:   Anxiety with less symptoms with good control.  DIAGNOSES:    ICD-10-CM   1. ADHD (attention deficit hyperactivity disorder), combined type  F90.2   2. 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 Generalized anxiety disorder  F41.1   4. Autism spectrum disorder  F84.0   5. History of learning disability  Z87.898   6. Dysgraphia  R27.8   7. Patient counseled  Z71.9   8. 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 Optic nerve cupping,  bilateral  H47.393   11. Raynaud's disease without gangrene  I73.00   12. Medication management  Z79.899   13. Asthma, mild intermittent, well-controlled  J45.20   14. Mild intellectual disability  F70   15. Goals of care, counseling/discussion  Z71.89   16. Leg  length discrepancy  M21.70   17. Attention deficit hyperactivity disorder (ADHD), combined type  F90.2 STRATTERA 80 MG capsule  18. 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   Discussed school academic progress and recommended continued accommodations as needed for work or application process.   Discussed growth and development and current weight. Recommended healthy food choices, watching portion sizes, avoiding second helpings, avoiding sugary drinks like soda and tea, drinking more water, getting more exercise.   Discussed continued need for structure, routine, reward (external), motivation (internal), positive reinforcement, consequences, and organization with home and job settings.   Encouraged recommended limitations on TV, tablets, phones, video games and computers for non-educational activities.   Discussed need for bedtime routine, use of good sleep hygiene, no video games, TV or phones for an hour before bedtime.   Encouraged physical activity and outdoor play, maintaining social distancing.   Counseled medication pharmacokinetics, options, dosage, administration, desired effects, and possible side effects.   Strattera 80 mg daily, # 30 with 2 RF's.Grayce Sessions for above e-scribed and sent to pharmacy on record  [id number] [place], [place] [address] [place] [address] Phone: [phone] Fax: [phone]  I discussed the assessment and treatment plan with the patient & parent. The patient & parent was provided an opportunity to ask questions and all were answered. The patient & parent agreed with the plan and demonstrated an understanding of the instructions.   I provided 35 minutes of non-face-to-face time during this encounter. Completed record review for 10 minutes prior to the virtual video  visit.   NEXT APPOINTMENT:  Return in about 3 months (around 05/10/2020) for follow up visit.  The patient & parent was advised to call back or seek an in-person evaluation if the symptoms worsen or if the condition fails to improve as anticipated.  Medical Decision-making: More than 50% of the appointment was spent counseling and discussing diagnosis and management of symptoms with the patient and family.  Carolann Littler, NP

## 2020-02-17 ENCOUNTER — Other Ambulatory Visit: Payer: Self-pay | Admitting: Pediatrics

## 2020-02-17 DIAGNOSIS — F902 Attention-deficit hyperactivity disorder, combined type: Secondary | ICD-10-CM

## 2020-02-17 MED ORDER — STRATTERA 80 MG PO CAPS: ORAL_CAPSULE | ORAL | 2 refills | Status: DC

## 2020-02-17 NOTE — Telephone Encounter (Signed)
Pharmacy fax required "Brand Medically Necessary" hand written on RX.  Printed, written and faxed back. RX for above e-scribed and sent to pharmacy on record  [id number] [place], [place] [address] [place] [address] Phone: [phone] Fax: [phone]

## 2020-04-13 ENCOUNTER — Telehealth: Payer: Self-pay

## 2020-04-13 NOTE — Telephone Encounter (Signed)
Pharm faxed in Prior Auth for Strattera.

## 2020-04-13 NOTE — Telephone Encounter (Signed)
Confirmation [id number] WPrior Approval [id number]  [id number]

## 2020-05-11 ENCOUNTER — Other Ambulatory Visit: Payer: Self-pay

## 2020-05-11 ENCOUNTER — Ambulatory Visit (INDEPENDENT_AMBULATORY_CARE_PROVIDER_SITE_OTHER): Payer: Medicaid Other | Admitting: Family

## 2020-05-11 ENCOUNTER — Encounter: Payer: Self-pay | Admitting: Family

## 2020-05-11 VITALS — BP 116/72 | HR 78 | Resp 16 | Ht 65.0 in | Wt 162.0 lb

## 2020-05-11 DIAGNOSIS — H518 Other specified disorders of binocular movement: Secondary | ICD-10-CM | POA: Diagnosis not present

## 2020-05-11 DIAGNOSIS — Z9109 Other allergy status, other than to drugs and biological substances: Secondary | ICD-10-CM

## 2020-05-11 DIAGNOSIS — Z719 Counseling, unspecified: Secondary | ICD-10-CM

## 2020-05-11 DIAGNOSIS — Z87898 Personal history of other specified conditions: Secondary | ICD-10-CM

## 2020-05-11 DIAGNOSIS — F902 Attention-deficit hyperactivity disorder, combined type: Secondary | ICD-10-CM | POA: Diagnosis not present

## 2020-05-11 DIAGNOSIS — F840 Autistic disorder: Secondary | ICD-10-CM

## 2020-05-11 DIAGNOSIS — Q9989 Other specified chromosome abnormalities: Secondary | ICD-10-CM

## 2020-05-11 DIAGNOSIS — H5010 Unspecified exotropia: Secondary | ICD-10-CM

## 2020-05-11 DIAGNOSIS — R278 Other lack of coordination: Secondary | ICD-10-CM

## 2020-05-11 DIAGNOSIS — Z7189 Other specified counseling: Secondary | ICD-10-CM

## 2020-05-11 DIAGNOSIS — Q998 Other specified chromosome abnormalities: Secondary | ICD-10-CM

## 2020-05-11 DIAGNOSIS — Z79899 Other long term (current) drug therapy: Secondary | ICD-10-CM

## 2020-05-11 DIAGNOSIS — Q100 Congenital ptosis: Secondary | ICD-10-CM

## 2020-05-11 DIAGNOSIS — M2170 Unequal limb length (acquired), unspecified site: Secondary | ICD-10-CM

## 2020-05-11 DIAGNOSIS — F79 Unspecified intellectual disabilities: Secondary | ICD-10-CM | POA: Diagnosis not present

## 2020-05-11 DIAGNOSIS — Q9388 Other microdeletions: Secondary | ICD-10-CM

## 2020-05-11 MED ORDER — STRATTERA 80 MG PO CAPS: ORAL_CAPSULE | ORAL | 2 refills | Status: DC

## 2020-05-11 NOTE — Progress Notes (Signed)
[place] [facility] [place] [facility] [address] [place] [address] Dept: [phone] Dept Fax: [phone] Loc: [phone] Loc Fax: [phone]  Medication Check  Patient ID: Joe Alvarez, male  DOB: 2001/08/05, 19 y.o.  MRN: [id number]  Date of Evaluation: 05/11/2020 PCP: No primary care provider on file.  Accompanied by: Mother Patient Lives with: parents  HISTORY/CURRENT STATUS: HPI Patient here with mother for the visit today. Patient quiet and answering questions when appropriate from provider. Xavier still job searching over the past few months with several job coaches due to [employer] and limited resources. Patient has applied to several jobs with no call back or job offers from the applications. Patient has continued with his routine f/u visits along with specialists and no changes to medications. More sleeping and bored recently with napping during the day due to limited activities/exercises with "hot weather" outside now. Has continued with Strattera 80 mg daily with no side effects or adverse effects.   EDUCATION/WORK: [place] for [employer] with work based learning completed without the clinical portion completed.  [place] program at college Performance/ [place], Resource/Inclusion and Other:help as needed Attempting to get work through [employer] in [place], but lives in [place]. Activities/ Exercise: intermittently with outside help in the yard.   MEDICAL HISTORY: Appetite:Good  MVI/Other: None  Sleep: Bedtime: 10:00 pm  Awakens:   Concerns: Initiation/Maintenance/Other: waking during the night, constipation wakes him on occasion and sits in the bathroom.   Individual Medical History/ Review of Systems: Changes? Velta Addison, Neurology in  February 2021 and eye doctor in [place] in November, [facility] in October.   Allergies: Atropine, Cyclopentolate, and Tetanus toxoids  Current Medications:  Current Outpatient Medications:  .  cetirizine (ZYRTEC) 10 MG tablet, Take 10 mg by mouth daily., Disp: , Rfl:  .  docusate sodium (STOOL SOFTENER) 100 MG capsule, Take 100 mg by mouth., Disp: , Rfl:  .  levETIRAcetam (KEPPRA) 500 MG tablet, 1 and 1/2 in am, 2 at night, Disp: , Rfl:  .  Multiple Vitamin (MULTIVITAMIN) capsule, Take by mouth., Disp: , Rfl:  .  Oxcarbazepine (TRILEPTAL) 300 MG tablet, TAKE 1 TABLET BY MOUTH TWICE A DAY, Disp: , Rfl:  .  pyridOXINE (VITAMIN B-6) 100 MG tablet, Take 100 mg by mouth 2 (two) times daily., Disp: , Rfl:  .  STRATTERA 80 MG capsule, TAKE 1 CAPSULE BY MOUTH EVERY DAY IN THE MORNING, Disp: 30 capsule, Rfl: 2 .  montelukast (SINGULAIR) 10 MG tablet, Take 1 tablet (10 mg total) by mouth daily., Disp: 30 tablet, Rfl: 12 Medication Side Effects: None  Family Medical/ Social History: Changes? None reported  MENTAL HEALTH: Mental Health Issues: Anxiety-less with current medications.   PHYSICAL EXAM; Vitals:  Vitals:   05/11/20 1402  BP: 116/72  Pulse: 78  Resp: 16  Height: 5\' 5"  (1.651 m)  Weight: 162 lb (73.5 kg)  BMI (Calculated): 26.96   General Physical Exam: Unchanged from previous exam, date: None  Changed:None reported  DIAGNOSES:    ICD-10-CM   1. Attention deficit hyperactivity disorder (ADHD), combined type  F90.2 STRATTERA 80 MG capsule  2. Intellectual disability  F79   3. 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 Leg length discrepancy  M21.70  8. 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 Exotropia  H50.10   11. 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 Environmental allergies  Z91.09   13. Dysgraphia   R27.8   14. Dyspraxia  R27.8   15. Medication management  Z79.899   16. Patient counseled  Z71.9   17. History of learning disability  Z87.898   18. Autism spectrum disorder  F84.0   19. Goals of care, counseling/discussion  Z71.89    RECOMMENDATIONS:  Counseling at this visit included the review of old records and/or current chart with the patient & parent with job searching, coaching assistance with job searches/applications, health and medications.   Discussed recent history and today's examination with patient & parent with no changes on exam today.   Counseled regarding health and recent job searching over the past few months.  Recommended a high protein, low sugar diet, watch portion sizes, avoid second helpings, avoid sugary snacks and drinks, drink more water, eat more fruits and vegetables, increase daily exercise.  Discussed school academic and behavioral progress and advocated for appropriate accommodations as needed for learning support. Needs help with job coaching.   Discussed importance of maintaining structure, routine, organization, reward, motivation and consequences with consistency at home and job searching.   Counseled medication pharmacokinetics, options, dosage, administration, desired effects, and possible side effects.   Strattera 80 mg daily, # 30 with 2 RF's.  RX for above e-scribed and sent to pharmacy on record  [id number] [place], [place] [address] [place] [address] Phone: [phone] Fax: [phone]  Advised importance of:  Good sleep hygiene (8- 10 hours per night, no TV or video games for 1 hour before bedtime) Limited screen time (none on school nights, no more than 2 hours/day on weekends, use of screen time for motivation) Regular exercise(outside and active play) Healthy eating (drink water or milk, no sodas/sweet tea, limit portions and no seconds).   NEXT APPOINTMENT: Return in  about 3 months (around 08/11/2020) for f/u visit .  Medical Decision-making: More than 50% of the appointment was spent counseling and discussing diagnosis and management of symptoms with the patient and family.  Carolann Littler, NP Counseling Time: 40 mins  Total Contact Time: 45 mins

## 2020-08-05 ENCOUNTER — Encounter: Payer: Self-pay | Admitting: Family

## 2020-08-05 ENCOUNTER — Other Ambulatory Visit: Payer: Self-pay

## 2020-08-05 ENCOUNTER — Ambulatory Visit (INDEPENDENT_AMBULATORY_CARE_PROVIDER_SITE_OTHER): Payer: Medicaid Other | Admitting: Family

## 2020-08-05 VITALS — BP 112/64 | HR 74 | Resp 16 | Ht 64.96 in | Wt 167.4 lb

## 2020-08-05 DIAGNOSIS — I7300 Raynaud's syndrome without gangrene: Secondary | ICD-10-CM

## 2020-08-05 DIAGNOSIS — Z719 Counseling, unspecified: Secondary | ICD-10-CM

## 2020-08-05 DIAGNOSIS — Z7189 Other specified counseling: Secondary | ICD-10-CM

## 2020-08-05 DIAGNOSIS — Q999 Chromosomal abnormality, unspecified: Secondary | ICD-10-CM | POA: Diagnosis not present

## 2020-08-05 DIAGNOSIS — G40909 Epilepsy, unspecified, not intractable, without status epilepticus: Secondary | ICD-10-CM

## 2020-08-05 DIAGNOSIS — Q100 Congenital ptosis: Secondary | ICD-10-CM

## 2020-08-05 DIAGNOSIS — R278 Other lack of coordination: Secondary | ICD-10-CM

## 2020-08-05 DIAGNOSIS — H518 Other specified disorders of binocular movement: Secondary | ICD-10-CM

## 2020-08-05 DIAGNOSIS — H5010 Unspecified exotropia: Secondary | ICD-10-CM

## 2020-08-05 DIAGNOSIS — F902 Attention-deficit hyperactivity disorder, combined type: Secondary | ICD-10-CM | POA: Diagnosis not present

## 2020-08-05 DIAGNOSIS — F79 Unspecified intellectual disabilities: Secondary | ICD-10-CM

## 2020-08-05 DIAGNOSIS — F70 Mild intellectual disabilities: Secondary | ICD-10-CM

## 2020-08-05 DIAGNOSIS — Z79899 Other long term (current) drug therapy: Secondary | ICD-10-CM

## 2020-08-05 DIAGNOSIS — Z87898 Personal history of other specified conditions: Secondary | ICD-10-CM

## 2020-08-05 DIAGNOSIS — M2170 Unequal limb length (acquired), unspecified site: Secondary | ICD-10-CM

## 2020-08-05 DIAGNOSIS — Q9388 Other microdeletions: Secondary | ICD-10-CM | POA: Diagnosis not present

## 2020-08-05 MED ORDER — STRATTERA 80 MG PO CAPS: ORAL_CAPSULE | ORAL | 2 refills | Status: DC

## 2020-08-05 NOTE — Progress Notes (Signed)
[facility] [place] [facility] [address] [place] [address] Dept: [phone] Dept Fax: [phone] Loc: [phone] Loc Fax: [phone]  Medication Check  Patient ID: Joe Alvarez, male  DOB: [birth year], 19 y.o.  MRN: [id number]  Date of Evaluation: 08/05/2020 PCP: Marcha Solders, MD  Accompanied by: Mother Patient Lives with: parents  HISTORY/CURRENT STATUS: HPI Patient here with mother for the visit today. Patient interactive with provider ans answering questions appropriately. Patient now volunteering several days each week and stopped job searching right now. 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 Patient mostly compliant with medications daily with no side effects.   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 Several job application submitted recently for work  Activities/ Exercise: participates in bowling one time weekly (Wednesday), practice league  MEDICAL HISTORY: Appetite: Good  MVI/Other: B-6 supplements  Sleep: Bedtime: 10:00 pm  Awakens: 7:45 am on work days Concerns: Initiation/Maintenance/Other: None  Individual Medical History/ Review of Systems: Changes? :None   Allergies: Atropine, Cyclopentolate, and Tetanus toxoids  Current Medications: Current Outpatient Medications  Medication Instructions   cetirizine (ZYRTEC) 10 mg, Oral, Daily   docusate sodium (STOOL SOFTENER) 100 mg, Oral   levETIRAcetam (KEPPRA) 500 MG tablet 1 and 1/2 in am, 2 at night   montelukast (SINGULAIR) 10 mg, Oral, Daily   Multiple Vitamin (MULTIVITAMIN) capsule Oral   Oxcarbazepine (TRILEPTAL) 300 MG tablet TAKE 1 TABLET BY MOUTH TWICE A DAY   pyridOXINE (VITAMIN B-6) 100 mg, 2 times daily   STRATTERA 80  MG capsule TAKE 1 CAPSULE BY MOUTH EVERY DAY IN THE MORNING   Medication Side Effects: None  Family Medical/ Social History: Changes? None reported  MENTAL HEALTH: Mental Health Issues: Anxiety-Strattera with good symptom control  PHYSICAL EXAM; Vitals:  Vitals:   08/05/20 1014  BP: 112/64  Pulse: 74  Resp: 16  Height: 5' 4.96" (1.65 m)  Weight: 167 lb 6.4 oz (75.9 kg)  BMI (Calculated): 27.89    General Physical Exam: Unchanged from previous exam, date:05/11/2020 Changed:none reported  DIAGNOSES:    ICD-10-CM   1. Attention deficit hyperactivity disorder (ADHD), combined type  F90.2   2. 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 Chromosomal abnormality syndrome  Q99.9   4. Intellectual disability  F79   5. Mild intellectual disability  F70   6. Seizure disorder ([place])  G40.909   7. Raynaud's disease without gangrene  I73.00   8. Exotropia  H50.10   9. 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 Dissociated vertical deviation  H51.8   11. Dysgraphia  R27.8   12. Dyspraxia  R27.8   13. History of learning disability  Z87.898   14. Medication management  Z79.899   15. Patient counseled  Z71.9   16. Leg length discrepancy  M21.70   17. 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 Goals of care, counseling/discussion  Z71.89    RECOMMENDATIONS: Counseling at this visit included the review of old records and/or current chart with the patient & parent with updates for volunteer, applications for work, health and medications.   Discussed recent history and today's examination with patient & parent with no changes on exam today.   Counseled regarding recent updates with health and any changes.  Will continue to monitor.   Recommended a high protein, low  sugar diet, watch portion sizes, avoid second helpings, avoid sugary snacks and drinks, drink more water, eat more fruits and vegetables, increase daily exercise.  Discussed work progress and advocated for appropriate accommodations as  needed for work with support as needed.   Discussed importance of maintaining structure, routine, organization, reward, motivation and consequences with consistency with home and volunteer work.   Counseled medication pharmacokinetics, options, dosage, administration, desired effects, and possible side effects.   Strattera 80 mg daily, # 30 with 2 RF's. Printed copy to sign and fax to [place] for above e-scribed and sent to pharmacy on record  [id number] [place], [place] [address] [place] [address] Phone: [phone] Fax: [phone]  Advised importance of:  Good sleep hygiene (8- 10 hours per night, no TV or video games for 1 hour before bedtime) Limited screen time (none on school nights, no more than 2 hours/day on weekends, use of screen time for motivation) Regular exercise(outside and active play) Healthy eating (drink water or milk, no sodas/sweet tea, limit portions and no seconds).  NEXT APPOINTMENT: Return in about 3 months (around 11/05/2020) for f/u visit.  Medical Decision-making: More than 50% of the appointment was spent counseling and discussing diagnosis and management of symptoms with the patient and family.  Carolann Littler, NP Counseling Time: 40 mins  Total Contact Time: 45 mins

## 2020-08-30 ENCOUNTER — Ambulatory Visit (INDEPENDENT_AMBULATORY_CARE_PROVIDER_SITE_OTHER): Payer: Medicaid Other | Admitting: Pediatrics

## 2020-08-30 ENCOUNTER — Other Ambulatory Visit: Payer: Self-pay

## 2020-08-30 VITALS — BP 112/80 | Ht 65.25 in | Wt 168.2 lb

## 2020-08-30 DIAGNOSIS — Z68.41 Body mass index (BMI) pediatric, 5th percentile to less than 85th percentile for age: Secondary | ICD-10-CM

## 2020-08-30 DIAGNOSIS — Q9388 Other microdeletions: Secondary | ICD-10-CM

## 2020-08-30 DIAGNOSIS — Z00129 Encounter for routine child health examination without abnormal findings: Secondary | ICD-10-CM

## 2020-08-30 DIAGNOSIS — Z23 Encounter for immunization: Secondary | ICD-10-CM

## 2020-08-30 DIAGNOSIS — Z0001 Encounter for general adult medical examination with abnormal findings: Secondary | ICD-10-CM | POA: Diagnosis not present

## 2020-08-30 DIAGNOSIS — G40909 Epilepsy, unspecified, not intractable, without status epilepticus: Secondary | ICD-10-CM

## 2020-08-30 DIAGNOSIS — Z0000 Encounter for general adult medical examination without abnormal findings: Secondary | ICD-10-CM

## 2020-08-30 MED ORDER — MONTELUKAST SODIUM 10 MG PO TABS: 10.0000 mg | ORAL_TABLET | Freq: Every day | ORAL | 12 refills | Status: AC

## 2020-08-30 MED ORDER — CETIRIZINE HCL 10 MG PO TABS: 10.0000 mg | ORAL_TABLET | Freq: Every day | ORAL | 12 refills | Status: DC

## 2020-08-30 NOTE — Progress Notes (Signed)
Cough---runny  Adolescent Well Care Visit Joe Alvarez is a 19 y.o. male who is here for well care.    PCP:  Marcha Solders, MD   History was provided by the patient and mother.  Confidentiality was discussed with the patient and, if applicable, with caregiver as well.  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  Surgeries Eye surgery-2001-2014 for alignment  TM tubes X 3 2002-2007 T's and A's in 2004 Salivary gland surgery Dental surgery for wisdom teeth removal Right ear cyst removal   Medications: Keppra--1750 mg--750am, 1000pm Trileptal-300 mg BID Strattera-80 mg daily Singulair--10 mg daily Zyrtec 10 mg daily Vitamin B6 and multivitamin daily Stool softener   Nutrition: Nutrition/Eating Behaviors: good Adequate calcium in diet?: yes Supplements/ Vitamins: yes  Exercise/ Media: Play any Sports?/ Exercise: no Screen Time:  < 2 hours Media Rules or Monitoring?: yes  Sleep:  Sleep: good  Social Screening: Lives with:  parents Parental relations:  good Activities, Work, and Programmer, systems Concerns regarding behavior with peers?  no Stressors of note: no  Education:  School Grade: working   Menstruation:   No LMP for male patient.   Confidential Social History: Tobacco?  no Secondhand smoke exposure?  no Drugs/ETOH?  no  Sexually Active?  no    Screenings: Patient has a dental home: yes  The following were discussed --- healthy eating, exercise, seatbelt use, bullying, abuse/trauma, weapon use, tobacco use, marijuana use, drug use, condom use, birth control, sexuality, suicidality/self harm, mental health issues, social isolation, school problems, family problems and screen time    PHQ-9 deferred -already followed by psychiatry and counselor.  Physical Exam:   Vitals:   08/30/20 0950  BP: 112/80  Weight: 168 lb 3.2 oz (76.3 kg)  Height: 5' 5.25" (1.657 m)   BP 112/80   Ht 5' 5.25" (1.657 m)   Wt 168 lb 3.2 oz (76.3 kg)   BMI 27.78 kg/m  Body mass index: body mass index is 27.78 kg/m. Growth percentile SmartLinks can only be used for patients less than 25 years old.   Hearing Screening   _0  _1  _2  _3  _4  _5  _6  _7  _8   Right ear:   _9 Left ear:   _10 General Appearance:   alert, oriented, no acute distress and well nourished  HENT: Normocephalic, no obvious abnormality, conjunctiva clear  Mouth:   Normal appearing teeth, no obvious discoloration, dental caries, or dental caps  Neck:   Supple; thyroid: no enlargement, symmetric, no tenderness/mass/nodules  Chest normal  Lungs:   Clear to auscultation bilaterally, normal work of breathing  Heart:   Regular rate and rhythm, S1 and S2 normal, no murmurs;   Abdomen:   Soft, non-tender, no mass, or organomegaly  GU normal male genitals, no testicular masses or hernia  Musculoskeletal:   Tone and strength strong and symmetrical, all extremities               Lymphatic:   No cervical adenopathy  Skin/Hair/Nails:   Skin warm, dry and intact, no rashes, no bruises or petechiae  Neurologic:   Strength, gait, and coordination normal and age-appropriate     Assessment and Plan:   Well young adult  BMI is appropriate for age  Hearing screening result:normal Vision screening result: normal  Counseling provided for all of the vaccine components  Orders Placed This Encounter  Procedures  . Flu Vaccine QUAD 6+ mos PF IM (Fluarix Quad PF)  . Tdap vaccine greater than or equal to 7yo IM   Indications, contraindications and side effects of vaccine/vaccines discussed with parent and parent verbally expressed understanding and also agreed with the administration of vaccine/vaccines as ordered above today.Handout (VIS) given for each  vaccine at this visit.   Return in about 6 months (around 02/27/2021).Marland Kitchen  Marcha Solders, MD

## 2020-08-30 NOTE — Patient Instructions (Signed)

## 2020-09-05 ENCOUNTER — Encounter: Payer: Self-pay | Admitting: Pediatrics

## 2020-09-05 DIAGNOSIS — Z0000 Encounter for general adult medical examination without abnormal findings: Secondary | ICD-10-CM | POA: Insufficient documentation

## 2020-11-03 ENCOUNTER — Telehealth (INDEPENDENT_AMBULATORY_CARE_PROVIDER_SITE_OTHER): Payer: Medicaid Other | Admitting: Family

## 2020-11-03 ENCOUNTER — Other Ambulatory Visit: Payer: Self-pay

## 2020-11-03 ENCOUNTER — Encounter: Payer: Self-pay | Admitting: Family

## 2020-11-03 DIAGNOSIS — G40909 Epilepsy, unspecified, not intractable, without status epilepticus: Secondary | ICD-10-CM

## 2020-11-03 DIAGNOSIS — F70 Mild intellectual disabilities: Secondary | ICD-10-CM

## 2020-11-03 DIAGNOSIS — Q998 Other specified chromosome abnormalities: Secondary | ICD-10-CM

## 2020-11-03 DIAGNOSIS — Z8659 Personal history of other mental and behavioral disorders: Secondary | ICD-10-CM

## 2020-11-03 DIAGNOSIS — Z719 Counseling, unspecified: Secondary | ICD-10-CM

## 2020-11-03 DIAGNOSIS — Q9388 Other microdeletions: Secondary | ICD-10-CM

## 2020-11-03 DIAGNOSIS — J4520 Mild intermittent asthma, uncomplicated: Secondary | ICD-10-CM | POA: Diagnosis not present

## 2020-11-03 DIAGNOSIS — Q999 Chromosomal abnormality, unspecified: Secondary | ICD-10-CM

## 2020-11-03 DIAGNOSIS — F840 Autistic disorder: Secondary | ICD-10-CM

## 2020-11-03 DIAGNOSIS — F902 Attention-deficit hyperactivity disorder, combined type: Secondary | ICD-10-CM

## 2020-11-03 DIAGNOSIS — H518 Other specified disorders of binocular movement: Secondary | ICD-10-CM

## 2020-11-03 DIAGNOSIS — Z79899 Other long term (current) drug therapy: Secondary | ICD-10-CM

## 2020-11-03 DIAGNOSIS — Z87898 Personal history of other specified conditions: Secondary | ICD-10-CM

## 2020-11-03 DIAGNOSIS — R278 Other lack of coordination: Secondary | ICD-10-CM

## 2020-11-03 DIAGNOSIS — Z7189 Other specified counseling: Secondary | ICD-10-CM

## 2020-11-03 MED ORDER — STRATTERA 80 MG PO CAPS: ORAL_CAPSULE | ORAL | 2 refills | Status: DC

## 2020-11-03 NOTE — Progress Notes (Signed)
[facility] [address], [place]. [address] [place] [address] Dept: [phone] Dept Fax: [phone]  Medication Check visit via Virtual Video   Patient ID:  Joe Alvarez  male DOB: [birth year]   20 y.o.   MRN: [id number]   DATE:11/03/20  PCP: Georgiann Hahn, MD  Virtual Visit via Video Note  I connected with  Arrie Aran  and Arrie Aran 's Mother (Name Mindi Junker) on 11/03/20 at 10:00 AM EST by a video enabled telemedicine application and verified that I am speaking with the correct person using two identifiers. Patient/Parent Location: at home   I discussed the limitations, risks, security and privacy concerns of performing an evaluation and management service by telephone and the availability of in person appointments. I also discussed with the parents that there may be a patient responsible charge related to this service. The parents expressed understanding and agreed to proceed.  Provider: Carron Curie, NP  Location: private work location  HISTORY/CURRENT STATUS: RIVAN SIORDIA is here for medication management of the psychoactive medications for ADHD and review of educational and behavioral concerns.   Kaemon currently taking medication regimen daily, which is working well. Takes medication as directed daily. Medication tends to last for the time needed during the day. Darey is able to focus through volunteer work and chores at home.   Cloyd is eating well (eating breakfast, lunch and dinner). Eating well with no recent changes.   Sleeping well (getting plenty of sleep), sleeping through the night.   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 Several job application submitted recently for work Still looking for other Neurosurgeon work or jobs in the [place], [place] area.  Vocational Rehabilitation  released him recently due to limited amount of opportunities provided to [place], travel time for parents to transport him and care worker not very involved with the process.   Activities/ Exercise: intermittently-with work and helping at home  Screen time: (phone, tablet, TV, computer): computer for games, TV, phone and movies.   MEDICAL HISTORY: Individual Medical History/ Review of Systems: Changes? :None reported recently.   Family Medical/ Social History: Changes? None reported recently.  Patient Lives with: parents and sibling  Current Medications:  Current Outpatient Medications  Medication Instructions  . cetirizine (ZYRTEC) 10 mg, Oral, Daily  . docusate sodium (COLACE) 100 mg, Oral  . levETIRAcetam (KEPPRA) 500 MG tablet 1 and 1/2 in am, 2 at night  . montelukast (SINGULAIR) 10 mg, Oral, Daily  . Multiple Vitamin (MULTIVITAMIN) capsule Oral  . Oxcarbazepine (TRILEPTAL) 300 MG tablet TAKE 1 TABLET BY MOUTH TWICE A DAY  . pyridOXINE (VITAMIN B-6) 100 mg, 2 times daily  . STRATTERA 80 MG capsule TAKE 1 CAPSULE BY MOUTH EVERY DAY IN THE MORNING   Medication Side Effects: None  MENTAL HEALTH: Mental Health Issues:   none reported recently    DIAGNOSES:    ICD-10-CM   1. ADHD (attention deficit hyperactivity disorder), combined type  F90.2   2. Asthma, mild intermittent, well-controlled  J45.20   3. Mild intellectual disability  F70   4. History of learning disability  Z87.898   5. Dysgraphia  R27.8   6. Dyspraxia  R27.8   7. Autism spectrum disorder  F84.0   8. History of anxiety  Z86.59   9. 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 Anomaly of chromosome pair  16  Q99.8   11. 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 Chromosomal abnormality syndrome  Q99.9   13. Patient counseled  Z71.9   14. Medication management  Z79.899   15. Goals of care, counseling/discussion  Z71.89   16. Dissociated vertical deviation  H51.8   17. Cerebral seizure (HCC)  G40.909    18. Seizure disorder (HCC)  G40.909    RECOMMENDATIONS:  Discussed recent history with patient & parent with updates on his current volunteer job, searching for other positions with animal care, health updates and medication management.   Discussed current progress and recommended continued accommodations for work or volunteer situation. Support provided to continued search for local volunteer/work positions.   Reviewed recent health updates since last visit along with any changes since last f/u visit.   Recommended healthy food choices, watching portion sizes, avoiding second helpings, avoiding sugary drinks like soda and tea, drinking more water, getting more exercise.   Discussed continued need for structure, routine, reward (external), motivation (internal), positive reinforcement, consequences, and organization  Encouraged recommended limitations on TV, tablets, phones, video games and computers for non-educational activities.   Discussed need for bedtime routine, use of good sleep hygiene, no video games, TV or phones for an hour before bedtime.   Encouraged physical activity and outdoor play, maintaining social distancing.   Counseled medication pharmacokinetics, options, dosage, administration, desired effects, and possible side effects.   Strattera 80 mg daily, # 30 with 2 RF's.RX for above e-scribed and sent to pharmacy on record  [id number] [place], [place] - [address] AT [facility] [address] [place] [address] Phone: [phone] Fax: [phone]  I discussed the assessment and treatment plan with the patient & parent. The patient & parent was provided an opportunity to ask questions and all were answered. The patient & parent agreed with the plan and demonstrated an understanding of the instructions.   I provided 45 minutes of non-face-to-face time during this encounter. Completed record review for 10 minutes prior to the virtual video  visit.   NEXT APPOINTMENT:  Return in about 3 months (around 02/01/2021) for f/u visit.  The patient & parent was advised to call back or seek an in-person evaluation if the symptoms worsen or if the condition fails to improve as anticipated.  Medical Decision-making: More than 50% of the appointment was spent counseling and discussing diagnosis and management of symptoms with the patient and family.  Carron Curie, NP

## 2020-11-09 ENCOUNTER — Telehealth: Payer: Self-pay

## 2020-11-09 ENCOUNTER — Other Ambulatory Visit: Payer: Self-pay | Admitting: Pediatrics

## 2020-11-09 MED ORDER — OSELTAMIVIR PHOSPHATE 75 MG PO CAPS: 75.0000 mg | ORAL_CAPSULE | Freq: Two times a day (BID) | ORAL | 0 refills | Status: AC

## 2020-11-09 NOTE — Telephone Encounter (Signed)
Mom has tested positive for flu. Joe Alvarez developed flu-like symptoms 1 day ago. Due to history of seizure disorders and symptoms, will treat with Tamiflu. Discussed potential side-effects with mother (naudea, vomiting, hallucinations) with mother. Medication is to be stopped if these side-effects develop. Mom verbalized understanding and agreement.

## 2020-11-09 NOTE — Telephone Encounter (Signed)
Mother has the flu and states patient is showing the same symptoms .She would like to know if we can call in meds or does she need to bring him into office .[facility] [address] ,[place]

## 2020-11-11 NOTE — Telephone Encounter (Signed)
meds called in by [facility]

## 2021-02-09 ENCOUNTER — Encounter: Payer: Medicaid Other | Admitting: Family

## 2021-02-16 ENCOUNTER — Other Ambulatory Visit: Payer: Self-pay

## 2021-02-16 ENCOUNTER — Ambulatory Visit (INDEPENDENT_AMBULATORY_CARE_PROVIDER_SITE_OTHER): Payer: Medicaid Other | Admitting: Family

## 2021-02-16 ENCOUNTER — Encounter: Payer: Self-pay | Admitting: Family

## 2021-02-16 VITALS — BP 118/68 | HR 78 | Resp 16 | Ht 65.16 in | Wt 173.6 lb

## 2021-02-16 DIAGNOSIS — H5010 Unspecified exotropia: Secondary | ICD-10-CM

## 2021-02-16 DIAGNOSIS — Q9388 Other microdeletions: Secondary | ICD-10-CM

## 2021-02-16 DIAGNOSIS — G40909 Epilepsy, unspecified, not intractable, without status epilepticus: Secondary | ICD-10-CM

## 2021-02-16 DIAGNOSIS — F902 Attention-deficit hyperactivity disorder, combined type: Secondary | ICD-10-CM | POA: Diagnosis not present

## 2021-02-16 DIAGNOSIS — I7300 Raynaud's syndrome without gangrene: Secondary | ICD-10-CM

## 2021-02-16 DIAGNOSIS — Z79899 Other long term (current) drug therapy: Secondary | ICD-10-CM

## 2021-02-16 DIAGNOSIS — Z87898 Personal history of other specified conditions: Secondary | ICD-10-CM | POA: Diagnosis not present

## 2021-02-16 DIAGNOSIS — Z7189 Other specified counseling: Secondary | ICD-10-CM

## 2021-02-16 DIAGNOSIS — Q999 Chromosomal abnormality, unspecified: Secondary | ICD-10-CM

## 2021-02-16 DIAGNOSIS — Z719 Counseling, unspecified: Secondary | ICD-10-CM

## 2021-02-16 DIAGNOSIS — F840 Autistic disorder: Secondary | ICD-10-CM

## 2021-02-16 DIAGNOSIS — F70 Mild intellectual disabilities: Secondary | ICD-10-CM

## 2021-02-16 DIAGNOSIS — Q998 Other specified chromosome abnormalities: Secondary | ICD-10-CM

## 2021-02-16 MED ORDER — STRATTERA 80 MG PO CAPS: ORAL_CAPSULE | ORAL | 2 refills | Status: DC

## 2021-02-16 NOTE — Progress Notes (Signed)
[facility] [address], [address] [place] [address] Dept: [phone] Dept Fax: [phone] Loc: [phone] Loc Fax: [phone]  Medication Check  Patient ID: Joe Alvarez, male  DOB: [birth year], 20 y.o.  MRN: [id number]  Date of Evaluation: 02/18/2021 PCP: Marcha Solders, MD  Accompanied by: Mother Patient Lives with: parents  HISTORY/CURRENT STATUS: HPI Patient here with mother for the visit today. Patient answering questions when asked with help from mother communicating his thoughts, but mostly quiet during the visit (this is normal for Joe Alvarez). Has continued with looking for volunteer work in [place]. Still at the [employer] a few days each week to help. Still bowling every Wednesday night on a league with improving average. Getting out on occasion when the weather is nice. No changes with medications recently from the Neurologist and may consider weaning off at next visit. Has continued with his Strattera dose, but not consistently. Mother reports she can tell when he hasn't taken his medication.   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   MEDICAL HISTORY: Appetite: Good No vitamins  Sleep: No issues with sleeping Concerns: Initiation/Maintenance/Other: None  Individual Medical History/ Review of Systems: Changes? :Yes, seen neurology via telehealth. New doctor for primary care. .   Allergies: Atropine, Cyclopentolate, Tetanus toxoids, and Other  Current Medications: Current Outpatient Medications  Medication Instructions  . cetirizine (ZYRTEC) 10 MG tablet TAKE 1  TABLET BY MOUTH EVERY DAY  . docusate sodium (COLACE) 100 mg, Oral  . fluticasone (FLONASE) 50 MCG/ACT nasal spray SMARTSIG:2 Puff(s) Both Nares Daily  . levETIRAcetam (KEPPRA) 500 MG tablet 1 and 1/2 in am, 2 at night  . montelukast (SINGULAIR) 10 mg, Oral, Daily  . Multiple Vitamin (MULTIVITAMIN) capsule Oral  . Oxcarbazepine (TRILEPTAL) 300 MG tablet TAKE 1 TABLET BY MOUTH TWICE A DAY  . pyridOXINE (VITAMIN B-6) 100 mg, 2 times daily  . STRATTERA 80 MG capsule TAKE 1 CAPSULE BY MOUTH EVERY DAY IN THE MORNING   Medication Side Effects: None  Family Medical/ Social History: Changes? None  MENTAL HEALTH: Mental Health Issues: Anxiety-less now  PHYSICAL EXAM; Vitals: Vitals:   02/16/21 0909  BP: 118/68  Pulse: 78  Resp: 16  Height: 5' 5.16" (1.655 m)  Weight: 173 lb 9.6 oz (78.7 kg)  BMI (Calculated): 28.75   General Physical Exam: Unchanged from previous exam, date: None Changed: None  DIAGNOSES:    ICD-10-CM   1. ADHD (attention deficit hyperactivity disorder), combined type  F90.2   2. Seizure disorder ([place])  G40.909   3. Autism spectrum disorder  F84.0   4. History of learning disability  Z87.898   5. 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 Chromosomal abnormality syndrome  Q99.9   7. 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 Raynaud's disease without gangrene  I73.00   10. Mild intellectual disability  F70   11. Exotropia  H50.10   12. Medication management  Z79.899   13. Patient counseled  Z71.9   14. Goals of care, counseling/discussion  Z71.89     ASSESSMENT: Patient here with mother today  for visit and quiet, but answering some questions. Has continued to be at home with some volunteering a few days each week at the [employer]. Mother reports looking for volunteer work at the [employer] along with other local entities (animal shelters) for other opportunities to work with animals. 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 Had f/u with other medical specialist and PCP for routine care with no medication changes. Has continued with Strattera 80 mg most days. Not consistent with medication daily, no not sure if medication dose needs to be adjusted or not. To continue with same dose before changing to a different dose or medication.   RECOMMENDATIONS:  Mother and patient provided updates with volunteer work, job Field seismologist, continued applications for work and services through [employer] in [place], health and medications.  Information for medical specialists reviewed with mother and patient. Had f/u with adult neurology via telehealth in March for routine care. Has had no seizures and looking at possible weaning of seizure medication to monotherapy. This may occur once seizure free at least 2 years.   Patient has continued with volunteer work at the [employer] a few days each week for about 3 hours on average each day. Had a Licensed conveyancer through [employer] with job applications. Nothing that has worked out in their area due to limited travel and ability due to [place] outside of [employer]. 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   Encouraged to get outside more with nice weather. Some activity will help with health promotion and feeling of accomplishment. Only getting some activity with his volunteer work, bowling 1 day/week or helping around the house. Encouraged a variety of foods with limiting snacking between meals and drinking more water.  Inconsistency with medication for daily dosing reviewed with mother and patient. Some days he forgets or not taking at the same time, which makes it difficult for efficacy. Not sure if dose needs an adjustment or if it is just the "forgetting" with limited  effects and seeing results of not having the medication. To continued with the Strattera 80 mg daily on a consistent basis before changing dose.   Counseled medication pharmacokinetics, options, dosage, administration, desired effects, and possible side effects.  I discussed the assessment and treatment plan with the patient. The patient were provided an opportunity to ask questions and all were answered. The patient agreed with the plan and demonstrated an understanding of the instructions.  NEXT APPOINTMENT: Return in about 3 months (around 05/18/2021) for f/u visit.  Carolann Littler, NP Counseling Time: 40 mins Total Contact Time: 45 mins

## 2021-02-18 ENCOUNTER — Encounter: Payer: Self-pay | Admitting: Family

## 2021-02-22 ENCOUNTER — Telehealth: Payer: Self-pay

## 2021-02-22 NOTE — Telephone Encounter (Signed)
Form dropped off & placed in basket  Last wcc 08/30/20  *return to [place] when completed*

## 2021-02-23 NOTE — Telephone Encounter (Signed)
Child medical report filled  

## 2021-05-25 ENCOUNTER — Ambulatory Visit (INDEPENDENT_AMBULATORY_CARE_PROVIDER_SITE_OTHER): Payer: Medicaid Other | Admitting: Family

## 2021-05-25 ENCOUNTER — Encounter: Payer: Self-pay | Admitting: Family

## 2021-05-25 ENCOUNTER — Other Ambulatory Visit: Payer: Self-pay

## 2021-05-25 VITALS — BP 108/68 | HR 76 | Resp 16 | Ht 65.25 in | Wt 176.0 lb

## 2021-05-25 DIAGNOSIS — F902 Attention-deficit hyperactivity disorder, combined type: Secondary | ICD-10-CM

## 2021-05-25 DIAGNOSIS — R278 Other lack of coordination: Secondary | ICD-10-CM | POA: Diagnosis not present

## 2021-05-25 DIAGNOSIS — Q998 Other specified chromosome abnormalities: Secondary | ICD-10-CM

## 2021-05-25 DIAGNOSIS — Z7189 Other specified counseling: Secondary | ICD-10-CM

## 2021-05-25 DIAGNOSIS — Z79899 Other long term (current) drug therapy: Secondary | ICD-10-CM

## 2021-05-25 DIAGNOSIS — Z87898 Personal history of other specified conditions: Secondary | ICD-10-CM | POA: Diagnosis not present

## 2021-05-25 DIAGNOSIS — Z719 Counseling, unspecified: Secondary | ICD-10-CM

## 2021-05-25 DIAGNOSIS — Q9388 Other microdeletions: Secondary | ICD-10-CM

## 2021-05-25 DIAGNOSIS — G40909 Epilepsy, unspecified, not intractable, without status epilepticus: Secondary | ICD-10-CM | POA: Diagnosis not present

## 2021-05-25 DIAGNOSIS — Q9989 Other specified chromosome abnormalities: Secondary | ICD-10-CM

## 2021-05-25 DIAGNOSIS — Q999 Chromosomal abnormality, unspecified: Secondary | ICD-10-CM

## 2021-05-25 DIAGNOSIS — F70 Mild intellectual disabilities: Secondary | ICD-10-CM

## 2021-05-25 DIAGNOSIS — H5010 Unspecified exotropia: Secondary | ICD-10-CM

## 2021-05-25 DIAGNOSIS — F840 Autistic disorder: Secondary | ICD-10-CM

## 2021-05-25 MED ORDER — STRATTERA 80 MG PO CAPS: ORAL_CAPSULE | ORAL | 2 refills | Status: DC

## 2021-05-25 NOTE — Progress Notes (Signed)
[facility] [place] [address] [place] [address] Dept: [phone] Dept Fax: [phone] Loc: [phone] Loc Fax: [phone]  Medication Check  Patient ID: Joe Alvarez, male  DOB: 2001-06-06, 20 y.o.  MRN: [id number]  Date of Evaluation: 05/25/2021 PCP: Marcha Solders, MD  Accompanied by: Father Patient Lives with: parents  HISTORY/CURRENT STATUS: HPI Patient here with father for the visit today. Patient quiet and answering questions when needing to with help from father. Patient has continued with f/u with specialists as needed. Not currently working and looking for work. No other reported issues with healthcare and sleeping with no concerns. Has continued with Strattera with on issues reported.   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 Barnet Pall is weekly.   MEDICAL HISTORY: Appetite: Good  MVI/Other: MVI daily and B-6 vitamins    Sleep: Bedtime: 11-1:00 am  Awakens: 11-12:00 pm  Concerns: Initiation/Maintenance/Other: None reported.   Individual Medical History/ Review of Systems: Changes? Neurology was seen via tele-health recently and has regular f/u appts scheduled.   Allergies: Atropine, Cyclopentolate, Tetanus toxoids, and Other  Current Medications:  Current Outpatient Medications  Medication Instructions   cetirizine (ZYRTEC) 10 MG tablet TAKE 1 TABLET BY MOUTH EVERY DAY   docusate sodium (COLACE) 100 mg, Oral   fluticasone (FLONASE) 50 MCG/ACT nasal spray SMARTSIG:2 Puff(s) Both Nares Daily   levETIRAcetam (KEPPRA) 500 MG tablet 1 and 1/2 in am, 2 at night   montelukast (SINGULAIR) 10 mg, Oral, Daily   Multiple Vitamin (MULTIVITAMIN) capsule Oral   Oxcarbazepine (TRILEPTAL) 300 MG tablet TAKE 1  TABLET BY MOUTH TWICE A DAY   pyridOXINE (VITAMIN B-6) 100 mg, 2 times daily   SODIUM FLUORIDE 5000 PLUS 1.1 % CREA dental cream 1 application, 2 times daily   STRATTERA 80 MG capsule TAKE 1 CAPSULE BY MOUTH EVERY DAY IN THE MORNING  Medication Side Effects: None  Family Medical/ Social History: Changes? Yes mother with recent stroke-like symptoms. Having headaches and seeing neurology.   MENTAL HEALTH: Mental Health Issues: Anxiety-some depending on situation.   PHYSICAL EXAM; Vitals:  Vitals:   05/25/21 0912  BP: 108/68  Pulse: 76  Resp: 16  Weight: 176 lb (79.8 kg)  Height: 5' 5.25" (1.657 m)    General Physical Exam: Unchanged from previous exam, date:02/16/2021 Changed:None  DIAGNOSES:    ICD-10-CM   1. Attention deficit hyperactivity disorder (ADHD), combined type  F90.2     2. History of learning disability  Z87.898     3. Dysgraphia  R27.8     4. Dyspraxia  R27.8     5. Cerebral seizure ([place])  G40.909     6. 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 Exotropia  H50.10     8. Autism spectrum disorder  F84.0     9. Mild intellectual disability  F70     10. 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 Medication management  Z79.899     14. Patient counseled  Z71.9     15. Goals of care, counseling/discussion  Z71.89      ASSESSMENT: [name] is a 20 year old male with a history of  ADHD, ASD, Learning disabilities, ID and chromosomal abnormality. Patient has been well controlled on Strattera 80 mg daily with no side effects.   RECOMMENDATIONS:  Patient and father provided updates for work, job Field seismologist, Psychologist, occupational work, Media planner and medical changes in the past 3 months.   Not currently working and have attempted several jobs with limited success. Volunteering 2-3 times/week at the [employer].   Working with case Freight forwarder with [employer]  but difficulties finding a 'good fit' for Jarom related to ongoing health issues.   No recent f/u visits with specialists, so no recent changes with any other medications.   Encouraged some more activity with exercise daily. Also suggested a good variety of foods each day with more fruits/vegetable for fiber. Having issues with constipation and encouraged the use of Miralax for regular bowel movements.   No issues with sleeping but sleep schedule is off. Patient has shifted his sleeping to going to bed later and sleeping in later in the morning. To consider adjusting to better routine each day for more morning time waking before 10:00 am.  Reviewed recent health issues with mother and continued need for f/u's with neurological issues. No definite diagnosis and seeing specialists today for care and testing. Support given to parent and patient today.   Counseled medication pharmacokinetics, options, dosage, administration, desired effects, and possible side effects.   Strattera 80 mg daily, # 30 with 2 RF"s.RX for above e-scribed and sent to pharmacy on record  [facility] [place], [place] [id number] [place] [address] Phone: [phone] Fax: [phone]  I discussed the assessment and treatment plan with the patient. The patient was provided an opportunity to ask questions and all were answered. The patient agreed with the plan and demonstrated an understanding of the instructions. NEXT APPOINTMENT: Return in about 3 months (around 08/25/2021) for f/u visit.  Carolann Littler, NP Counseling Time: 28 mins Total Contact Time: 32 mins

## 2021-08-09 ENCOUNTER — Other Ambulatory Visit: Payer: Self-pay

## 2021-08-09 ENCOUNTER — Telehealth (INDEPENDENT_AMBULATORY_CARE_PROVIDER_SITE_OTHER): Payer: Medicaid Other | Admitting: Family

## 2021-08-09 ENCOUNTER — Encounter: Payer: Self-pay | Admitting: Family

## 2021-08-09 DIAGNOSIS — Z87898 Personal history of other specified conditions: Secondary | ICD-10-CM | POA: Diagnosis not present

## 2021-08-09 DIAGNOSIS — J4520 Mild intermittent asthma, uncomplicated: Secondary | ICD-10-CM

## 2021-08-09 DIAGNOSIS — G40909 Epilepsy, unspecified, not intractable, without status epilepticus: Secondary | ICD-10-CM

## 2021-08-09 DIAGNOSIS — Z719 Counseling, unspecified: Secondary | ICD-10-CM

## 2021-08-09 DIAGNOSIS — Q9388 Other microdeletions: Secondary | ICD-10-CM | POA: Diagnosis not present

## 2021-08-09 DIAGNOSIS — F70 Mild intellectual disabilities: Secondary | ICD-10-CM

## 2021-08-09 DIAGNOSIS — F840 Autistic disorder: Secondary | ICD-10-CM

## 2021-08-09 DIAGNOSIS — R278 Other lack of coordination: Secondary | ICD-10-CM

## 2021-08-09 DIAGNOSIS — Z7189 Other specified counseling: Secondary | ICD-10-CM

## 2021-08-09 DIAGNOSIS — F902 Attention-deficit hyperactivity disorder, combined type: Secondary | ICD-10-CM

## 2021-08-09 DIAGNOSIS — Q998 Other specified chromosome abnormalities: Secondary | ICD-10-CM

## 2021-08-09 DIAGNOSIS — H5010 Unspecified exotropia: Secondary | ICD-10-CM

## 2021-08-09 DIAGNOSIS — Z79899 Other long term (current) drug therapy: Secondary | ICD-10-CM

## 2021-08-09 MED ORDER — STRATTERA 80 MG PO CAPS: ORAL_CAPSULE | ORAL | 2 refills | Status: DC

## 2021-08-09 NOTE — Progress Notes (Signed)
[facility] [place]. [address] [place] [address] Dept: [phone] Dept Fax: [phone]  Medication Check visit via Virtual Video   Patient ID:  Joe Alvarez  male DOB: [birth year]   19 y.o.   MRN: [id number]   DATE:08/09/21  PCP: Joe Solders, MD  Virtual Visit via Video Note  I connected with  Joe Alvarez  and Joe Alvarez 's Mother (Name Joe Alvarez) on 08/09/21 at 10:00 AM EDT by a video enabled telemedicine application and verified that I am speaking with the correct person using two identifiers. Patient/Parent Location: at home   I discussed the limitations, risks, security and privacy concerns of performing an evaluation and management service by telephone and the availability of in person appointments. I also discussed with the parents that there may be a patient responsible charge related to this service. The parents expressed understanding and agreed to proceed.  Provider: Carolann Littler, NP  Location: private work location  HPI/CURRENT STATUS: Joe Alvarez is here for medication management of the psychoactive medications for ADHD and review of educational and behavioral concerns.   Joe Alvarez currently taking Joe Alvarez  which is working well. Takes medication daily as directed. Joe Alvarez is able to focus through volunteer work and chores.   Joe Alvarez is eating well (eating breakfast, lunch and dinner). Eating with no changes. MVI and B 6 vitamin daily. Not getting enough daily fiber.   Sleeping well (goes to bed at 11-1:00 am wakes at 11-12:00 pm), sleeping through the night.   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   Activities/ Exercise:  bowling-[employer]  Bowling weekly on  Wednesday  Screen time: (phone, tablet, TV, computer): TV, phone, and games.   MEDICAL HISTORY: Individual Medical History/ Review of Systems: None reported recently. Yearly neurology exam to be scheduled this year. PCP visit for November and dental visit this month.   Family Medical/ Social History: Changes? Yes Mother will increased issues with pain, migraines, facial/neck pain, and tinnitus  Patient Lives with: parents and sibling  MENTAL HEALTH: Mental Health Issues:    None     Allergies: Allergies  Allergen Reactions   Atropine Other (See Comments)    Pt can't see lines and colors   Cyclopentolate Other (See Comments)    Took 3 weeks for eye dilation to resolve   Tetanus Toxoids Swelling   Other Rash   Current Medications:  Current Outpatient Medications on File Prior to Visit  Medication Sig Dispense Refill   cetirizine (ZYRTEC) 10 MG tablet TAKE 1 TABLET BY MOUTH EVERY DAY 30 tablet 12   docusate sodium (COLACE) 100 MG capsule Take 100 mg by mouth.     levETIRAcetam (KEPPRA) 500 MG tablet 1 and 1/2 in am, 2 at night     Multiple Vitamin (MULTIVITAMIN) capsule Take by mouth.     Oxcarbazepine (TRILEPTAL) 300 MG tablet TAKE 1 TABLET BY MOUTH TWICE A DAY     pyridOXINE (VITAMIN B-6) 100 MG tablet Take 100 mg by mouth 2 (two) times daily.     fluticasone (FLONASE) 50 MCG/ACT nasal spray SMARTSIG:2 Puff(s) Both Nares Daily (Patient not taking: Reported on 08/09/2021)     montelukast (SINGULAIR) 10 MG tablet Take 1 tablet (10 mg total) by mouth daily.  30 tablet 12   SODIUM FLUORIDE 5000 PLUS 1.1 % CREA dental cream 1 application 2 (two) times daily. (Patient not taking: Reported on 08/09/2021)     No current facility-administered medications on file prior to visit.   Medication Side Effects: None  DIAGNOSES:    ICD-10-CM   1. ADHD (attention deficit hyperactivity disorder), combined type  F90.2     2. 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 Seizure disorder  ([place])  G40.909     4. Cerebral seizure ([place])  G40.909     5. History of learning disability  Z87.898     6. 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 Exotropia  H50.10     9. Mild intellectual disability  F70     10. Autism spectrum disorder  F84.0     11. Asthma, mild intermittent, well-controlled  J45.20     12. 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 Medication management  Z79.899     15. Patient counseled  Z71.9     16. Goals of care, counseling/discussion  Z71.89      ASSESSMENT: [name] is a 20 year old male with a history of ADHD, ASD, Learning difficulties, and ID, Still taking his Strattera 80 mg daily with good efficacy and no side efficacy. Still job searching and will reapply to jobs within close proximity to the house due to transportation availability. Performing daily chores at home along with some ADL's. Eating with no change. Sleeping with no concerns. No recent changes in health care reported. To continue with Strattera 80 mg with no changes today. Reassess medication at the next f/u in 3 months.   PLAN/RECOMMENDATIONS:  Discussed updates for volunteering, job applications, and activities.   Helping at home with chores and recently started a daily chores list for him and brother to complete each day.  Discussed of jobs and volunteer work with patient and mother today. Has continued to volunteer 2-3 days/week with no concerns. Looking at job applications in the near future.   Reviewed changes in health care or medications that have continued over the past 3 months. To have and schedule routine care visits along with specialists over the next few months.  Discussed assistance with cooking and being self sufficient during the day for meals. Will help with some meal prepping with supervision by mother.   Sleeping with no difficulties or concerns reported today. Has continued on a sleep schedule for bedtime  and wake cycle.    Counseled medication pharmacokinetics, options, dosage, administration, desired effects, and possible side effects.   Strattera 80 mg daily, # 30 with 2 RF's RX for above e-scribed and sent to pharmacy on record  [facility], [place] [address] [place] [address] Phone: [phone] Fax: [phone]  I discussed the assessment and treatment plan with the patient & parent. The patient & parent was provided an opportunity to ask questions and all were answered. The patient & parent agreed with the plan and demonstrated an understanding of the instructions.   I provided 48 minutes of non-face-to-face time during this encounter. Completed record review for 10 minutes prior to the virtual video visit.   NEXT APPOINTMENT:  11/01/2021  Return in about 3 months (around  11/09/2021) for f/u visit.  The patient & parent was advised to call back or seek an in-person evaluation if the symptoms worsen or if the condition fails to improve as anticipated.   Carolann Littler, NP

## 2021-09-05 ENCOUNTER — Other Ambulatory Visit: Payer: Self-pay | Admitting: Family

## 2021-09-06 NOTE — Telephone Encounter (Signed)
Strattera 80 mg daily, # 30 with no RF's.RX for above e-scribed and sent to pharmacy on record  [facility], [address] AT [place] [address] [place] [address] Phone: [phone] Fax: [phone]

## 2021-11-01 ENCOUNTER — Ambulatory Visit (INDEPENDENT_AMBULATORY_CARE_PROVIDER_SITE_OTHER): Payer: Medicaid Other | Admitting: Family

## 2021-11-01 ENCOUNTER — Encounter: Payer: Self-pay | Admitting: Family

## 2021-11-01 ENCOUNTER — Other Ambulatory Visit: Payer: Self-pay

## 2021-11-01 VITALS — BP 116/76 | HR 78 | Resp 16 | Ht 65.35 in | Wt 174.4 lb

## 2021-11-01 DIAGNOSIS — R278 Other lack of coordination: Secondary | ICD-10-CM

## 2021-11-01 DIAGNOSIS — Q9388 Other microdeletions: Secondary | ICD-10-CM | POA: Diagnosis not present

## 2021-11-01 DIAGNOSIS — F70 Mild intellectual disabilities: Secondary | ICD-10-CM | POA: Diagnosis not present

## 2021-11-01 DIAGNOSIS — F840 Autistic disorder: Secondary | ICD-10-CM

## 2021-11-01 DIAGNOSIS — G40909 Epilepsy, unspecified, not intractable, without status epilepticus: Secondary | ICD-10-CM | POA: Diagnosis not present

## 2021-11-01 DIAGNOSIS — H5010 Unspecified exotropia: Secondary | ICD-10-CM

## 2021-11-01 DIAGNOSIS — Q998 Other specified chromosome abnormalities: Secondary | ICD-10-CM

## 2021-11-01 DIAGNOSIS — Z87898 Personal history of other specified conditions: Secondary | ICD-10-CM

## 2021-11-01 DIAGNOSIS — Z7189 Other specified counseling: Secondary | ICD-10-CM

## 2021-11-01 DIAGNOSIS — Z719 Counseling, unspecified: Secondary | ICD-10-CM

## 2021-11-01 DIAGNOSIS — F902 Attention-deficit hyperactivity disorder, combined type: Secondary | ICD-10-CM

## 2021-11-01 DIAGNOSIS — Z79899 Other long term (current) drug therapy: Secondary | ICD-10-CM

## 2021-11-01 NOTE — Progress Notes (Signed)
[facility] [address] [place] [address] Dept: [phone] Dept Fax: [phone] Loc: [phone] Loc Fax: [phone]  Medication Check  Patient ID: Joe Alvarez, male  DOB: August 30, 2001, 21 y.o.  MRN: [id number]  Date of Evaluation: 11/01/2021 PCP: Darrol Jump, NP  Accompanied by: Mother Patient Lives with: parents and brother  HISTORY/CURRENT STATUS: HPI Patient here with mother for the visit today. Patient answering some questions as he can with appropriate responses. Mother providing feedback when needed for answer Phat is not sure of the response. Doing well at this point with no recent changes or medical updates. Strattera 80 mg daily has continued with no side effects reported.   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   MEDICAL HISTORY: Appetite: Breakfast on his own, doesn't eat lunch, snacking during the day, and eating dinner. Can help with dinner, but not make a meal on his own.  MVI/Other: Daily MVI and B-6    Sleep: Bedtime: 12:00 am  Awakens: 12:00 pm   Will change sleep schedule depending on the day.   Concerns: Initiation/Maintenance/Other: Unable to fall asleep and gets in bed before 12:00 am.   Individual Medical History/ Review of Systems: Changes? :None reported recently.   Allergies: Atropine, Cyclopentolate, Tetanus toxoids, and Other  Current Medications:  Current Outpatient Medications  Medication Instructions   cetirizine (ZYRTEC) 10 MG tablet TAKE 1 TABLET BY MOUTH EVERY DAY   docusate sodium (COLACE) 100 mg, Oral   fluticasone (FLONASE) 50 MCG/ACT nasal spray    levETIRAcetam (KEPPRA) 500 MG tablet 1 and 1/2 in am, 2 at night    montelukast (SINGULAIR) 10 mg, Oral, Daily   Multiple Vitamin (MULTIVITAMIN) capsule Oral   Oxcarbazepine (TRILEPTAL) 300 MG tablet TAKE 1 TABLET BY MOUTH TWICE A DAY   pyridOXINE (VITAMIN B-6) 100 mg, 2 times daily   SODIUM FLUORIDE 5000 PLUS 1.1 % CREA dental cream 1 application, 2 times daily   STRATTERA 80 MG capsule TAKE 1 CAPSULE BY MOUTH EVERY DAY IN THE MORNING  Medication Side Effects: None  Family Medical/ Social History: Changes? Yes, mother to have surgical procedure tomorrow.   MENTAL HEALTH: Mental Health Issues:  more socialization-attending the [facility] [employer] on Friday nights.   PHYSICAL EXAM; Vitals:  Vitals:   11/01/21 1023  BP: 116/76  Pulse: 78  Resp: 16  Weight: 174 lb 6.4 oz (79.1 kg)  Height: 5' 5.35" (1.66 m)    General Physical Exam: Unchanged from previous exam, date:08/09/2021, non-productive cough with no wheezing, rales or rhonchi on exam  Changed:None  DIAGNOSES:    ICD-10-CM   1. Attention deficit hyperactivity disorder (ADHD), combined type  F90.2     2. Seizure disorder ([place])  G40.909     3. 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 Mild intellectual disability  F70     5. History of learning disability  Z87.898     6. 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 Exotropia  H50.10     11. Autism spectrum disorder  F84.0     12.  Medication management  Z79.899     13. Patient counseled  Z71.9     14. Goals of care, counseling/discussion  Z71.89      ASSESSMENT: Joe Alvarez is a 21 year old male with a history of ADHD, ASD, L/D and IDD. He has been maintained on Strattera 80 mg daily with no side effects and reported efficacy. Has continued at home with parents and volunteering 2-3 days/week. Still looking for options for working part-time in the [place] area. [place] is also helping with chores around the house and dinner prepping or  completion. Eating and health with no recent changes. Sleeping with no concerns, but some nights doing to bed later and sleeping about 12 hours. Able to get up early on days with plans or activities. Will continue with his Strattera 80 mg daily with no changes today.   RECOMMENDATIONS:  Patient and mother provided updates for volunteer work, home, family and health in the past 3 months.   Patient not currently working and suggestions provided at the visit today.Discussed applying to local restaurants and businesses that high Individuals with Disabilities.   Still volunteering a few days each week with [employer] and participating with [facility] on Friday nights. Support and encouragement given.   Reviewed health history and any changes/updates over the past few months.  Eating with no current issues reported. Getting some activity with bowling, [employer], and volunteering.   Sleep schedule is later most days. Able to get up if has plans or activities. Discussed sleep hygiene and schedule for consistency with sleep.   Counseled medication pharmacokinetics, options, dosage, administration, desired effects, and possible side effects.   Strattera 80 mg daily, No Rx today.  I discussed the assessment and treatment plan with the patient & parent. The patient & parent was provided an opportunity to ask questions and all were answered. The patient & parent agreed with the plan and demonstrated an understanding of the instructions.  NEXT APPOINTMENT: Return in about 3 months (around 01/30/2022) for f/u visit.  The patient & parent was advised to call back or seek an in-person evaluation if the symptoms worsen or if the condition fails to improve as anticipated.  Carolann Littler, NP

## 2021-12-23 ENCOUNTER — Other Ambulatory Visit: Payer: Self-pay

## 2021-12-23 MED ORDER — STRATTERA 80 MG PO CAPS: ORAL_CAPSULE | ORAL | 2 refills | Status: DC

## 2021-12-23 NOTE — Telephone Encounter (Signed)
E-Prescribed Strattera 80 directly to  [id number] - [place] [address], [address] AT [place] [address] [place] [address] Phone: [phone] Fax: [phone]

## 2022-01-26 ENCOUNTER — Telehealth: Payer: Self-pay

## 2022-01-26 NOTE — Telephone Encounter (Signed)
Confirmation #:[id number] WPrior Approval [id number] Status:APPROVED ?

## 2022-02-22 ENCOUNTER — Institutional Professional Consult (permissible substitution): Payer: Medicaid Other | Admitting: Family

## 2022-03-03 ENCOUNTER — Other Ambulatory Visit: Payer: Self-pay

## 2022-03-03 NOTE — Telephone Encounter (Signed)
[facility] [address] [facility] not have Str[facility]a in stock mom would like it sent to [facility] on [address] ?

## 2022-03-06 MED ORDER — STRATTERA 80 MG PO CAPS: ORAL_CAPSULE | ORAL | 2 refills | Status: DC

## 2022-03-06 NOTE — Telephone Encounter (Signed)
Strattera 80 mg daily, # 30 with 2 RF's.RX for above e-scribed and sent to pharmacy on record ? ?[id number] [address], [place] ?[place] ?[place] [address] ?Phone: [phone] Fax: [phone] ? ? ?

## 2022-05-11 ENCOUNTER — Encounter: Payer: Self-pay | Admitting: Family

## 2022-05-11 ENCOUNTER — Ambulatory Visit (INDEPENDENT_AMBULATORY_CARE_PROVIDER_SITE_OTHER): Payer: Medicaid Other | Admitting: Family

## 2022-05-11 VITALS — BP 112/68 | HR 76 | Resp 16 | Ht 65.5 in | Wt 168.6 lb

## 2022-05-11 DIAGNOSIS — H5010 Unspecified exotropia: Secondary | ICD-10-CM | POA: Diagnosis not present

## 2022-05-11 DIAGNOSIS — Z87898 Personal history of other specified conditions: Secondary | ICD-10-CM

## 2022-05-11 DIAGNOSIS — F902 Attention-deficit hyperactivity disorder, combined type: Secondary | ICD-10-CM

## 2022-05-11 DIAGNOSIS — Z719 Counseling, unspecified: Secondary | ICD-10-CM

## 2022-05-11 DIAGNOSIS — M2170 Unequal limb length (acquired), unspecified site: Secondary | ICD-10-CM | POA: Diagnosis not present

## 2022-05-11 DIAGNOSIS — Q9388 Other microdeletions: Secondary | ICD-10-CM

## 2022-05-11 DIAGNOSIS — Z79899 Other long term (current) drug therapy: Secondary | ICD-10-CM

## 2022-05-11 DIAGNOSIS — G40909 Epilepsy, unspecified, not intractable, without status epilepticus: Secondary | ICD-10-CM

## 2022-05-11 DIAGNOSIS — Z7189 Other specified counseling: Secondary | ICD-10-CM

## 2022-05-11 DIAGNOSIS — R278 Other lack of coordination: Secondary | ICD-10-CM

## 2022-05-11 NOTE — Progress Notes (Unsigned)
[facility] [address] [place] [address] Dept: [phone] Dept Fax: [phone] Loc: [phone] Loc Fax: [phone]  Medication Check  Patient ID: Joe Alvarez, male  DOB: Jun 20, 2001, 21 y.o.  MRN: [id number]  Date of Evaluation: 05/11/2022 PCP: Darrol Jump, NP  Accompanied by: Mother Patient Lives with: parents and sibling  HISTORY/CURRENT STATUS: HPI Patient here with mother for the visit today. Patient answering some questions and appropriate with provider today. Patient had no issues or changes since last visit on 1/03//2023. Medication regimen has continued with no concerns or side effects. Not as consistent with daily dosing.   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   MEDICAL HISTORY: Appetite: Good    MVI/Other: MVI daily  Sleep: Bedtime: 1200  Awakens: 1100  Concerns: Initiation/Maintenance/Other: gets up at 0720 to get his watch.   Individual Medical History/ Review of Systems: Changes? :None reported recently. PCP for routine care on a yearly.   Allergies: Atropine, Cyclopentolate, Tetanus toxoids, and Other  Current Medications:  Current Outpatient Medications  Medication Instructions   cetirizine (ZYRTEC) 10 MG tablet TAKE 1 TABLET BY MOUTH EVERY DAY   docusate sodium (COLACE) 100 mg, Oral   fluticasone (FLONASE) 50 MCG/ACT nasal spray    levETIRAcetam (KEPPRA) 500 MG tablet 1 and 1/2 in am, 2 at night   montelukast (SINGULAIR) 5 MG chewable tablet Oral   montelukast (SINGULAIR) 10 mg, Oral, Daily   Multiple Vitamin (MULTIVITAMIN) capsule Oral   Oxcarbazepine (TRILEPTAL) 300 MG tablet TAKE 1 TABLET BY MOUTH TWICE A DAY   pyridOXINE (VITAMIN B-6) 100 mg, 2 times daily   SODIUM FLUORIDE  5000 PLUS 1.1 % CREA dental cream 1 application , 2 times daily   STRATTERA 80 MG capsule TAKE 1 CAPSULE (80 mg) BY MOUTH EVERY MORNING   Medication Side Effects: None Family Medical/ Social History: Changes? None   MENTAL HEALTH: Mental Health Issues: Anxiety with some situations  PHYSICAL EXAM; Vitals:  Vitals:   05/11/22 1002  BP: 112/68  Pulse: 76  Resp: 16  Weight: 168 lb 9.6 oz (76.5 kg)  Height: 5' 5.5" (1.664 m)    General Physical Exam: Unchanged from previous exam, date:11/01/2021 Changed:None  DIAGNOSES:    ICD-10-CM   1. ADHD (attention deficit hyperactivity disorder), combined type  F90.2     2. 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 Exotropia  H50.10     4. Leg length discrepancy  M21.70     5. Cerebral seizure ([place])  G40.909     6. Seizure disorder ([place])  G40.909     7. History of learning disability  Z87.898     8. Dysgraphia  R27.8     9. Dyspraxia  R27.8     10. Goals of care, counseling/discussion  Z71.89     11. Medication management  Z79.899     12. Patient counseled  Z71.9      ASSESSMENT: Enrico is a 21 year old male with a history of ADHD, L/D, Dysgraphia, Dyspraxia, He has been maintained on his medication regimen with inconsistency with daily dosing. Attempting to set reminders on his watch and wake early to take his medication on his own but has been unsuccessful. Has continued with volunteer services and still looking for employment opportunities. Continued with bowling and [facility]  on a regular basis. Eating well with no complaints. No medical changes or updates in the past few months. Sleeping well with no reported concerns. No changes with medications, but encouraged consistency.   RECOMMENDATIONS:  Updates for work searching and application process with mother and patient.   Volunteer work has continued a few days each week at [employer].  Opportunities with work and applications for work has been  continued.  Medical updates and health history reviewed since last f/u visit.  Peer interactions, socialization and activity on a regular basis discussed with patient.  Sleep hygiene and sleep schedule discussed with getting up early, but going back to sleep.  Medication management and consistency with daily dosing with suggestions for scheduled dosing.    Counseled medication pharmacokinetics, options, dosage, administration, desired effects, and possible side effects.   Strattera 80 mg daily, No Rx today.   I discussed the assessment and treatment plan with the patient & parent. The patient & parent was provided an opportunity to ask questions and all were answered. The patient & parent agreed with the plan and demonstrated an understanding of the instructions.  NEXT APPOINTMENT: Return in about 3 months (around 08/11/2022) for f/u visit .  The patient & parent was advised to call back or seek an in-person evaluation if the symptoms worsen or if the condition fails to improve as anticipated.  Carolann Littler, NP

## 2022-08-17 ENCOUNTER — Ambulatory Visit (INDEPENDENT_AMBULATORY_CARE_PROVIDER_SITE_OTHER): Payer: Medicaid Other | Admitting: Family

## 2022-08-17 ENCOUNTER — Encounter: Payer: Self-pay | Admitting: Family

## 2022-08-17 VITALS — BP 118/78 | HR 78 | Resp 16 | Ht 65.35 in | Wt 171.0 lb

## 2022-08-17 DIAGNOSIS — R278 Other lack of coordination: Secondary | ICD-10-CM

## 2022-08-17 DIAGNOSIS — Q999 Chromosomal abnormality, unspecified: Secondary | ICD-10-CM

## 2022-08-17 DIAGNOSIS — Z8659 Personal history of other mental and behavioral disorders: Secondary | ICD-10-CM

## 2022-08-17 DIAGNOSIS — Z719 Counseling, unspecified: Secondary | ICD-10-CM

## 2022-08-17 DIAGNOSIS — F902 Attention-deficit hyperactivity disorder, combined type: Secondary | ICD-10-CM

## 2022-08-17 DIAGNOSIS — H5010 Unspecified exotropia: Secondary | ICD-10-CM | POA: Diagnosis not present

## 2022-08-17 DIAGNOSIS — Q9388 Other microdeletions: Secondary | ICD-10-CM

## 2022-08-17 DIAGNOSIS — G40909 Epilepsy, unspecified, not intractable, without status epilepticus: Secondary | ICD-10-CM | POA: Diagnosis not present

## 2022-08-17 DIAGNOSIS — Z87898 Personal history of other specified conditions: Secondary | ICD-10-CM

## 2022-08-17 DIAGNOSIS — Q998 Other specified chromosome abnormalities: Secondary | ICD-10-CM

## 2022-08-17 DIAGNOSIS — Z79899 Other long term (current) drug therapy: Secondary | ICD-10-CM

## 2022-08-17 DIAGNOSIS — Z7189 Other specified counseling: Secondary | ICD-10-CM

## 2022-08-17 MED ORDER — STRATTERA 80 MG PO CAPS
ORAL_CAPSULE | ORAL | 2 refills | Status: DC
Start: 2022-08-17 — End: 2022-11-23

## 2022-08-17 NOTE — Progress Notes (Signed)
[facility] [address] [place] [address] Dept: [phone] Dept Fax: [phone] Loc: [phone] Loc Fax: [phone]  Medication Check  Patient ID: Joe Alvarez, male  DOB: Apr 28, 2001, 21 y.o.  MRN: [id number]  Date of Evaluation: 08/17/2022 PCP: Darrol Jump, NP  Accompanied by: Mother Patient Lives with: parents and sibling  HISTORY/CURRENT STATUS: HPI Patient here for the visit with mother for the visit today. Patient interactive and appropriate with provider today. Patient with no significant changes since last f/u visit on 05/11/2022. Patient has been consistent with medication regimen with no side effects.   EDUCATION/WORK: Volunteering:[employer] 9-11 or 1:00 pm  Several days/week Activities/ Exercise: participates in bowling  MEDICAL HISTORY: Appetite: Good MVI/Other: Vitamin B6 and MVI daily  Sleep: Bedtime: 2400  Awakens: 1200 unless working that morning Concerns: Initiation/Maintenance/Other: None  Individual Medical History/ Review of Systems: Changes? : Yes, new PCP in November to establish care, dentist and neurology on 09/25/2022.  Allergies: Atropine, Cyclopentolate, Tetanus toxoids, and Other  Current Medications:  Current Outpatient Medications  Medication Instructions   cetirizine (ZYRTEC) 10 MG tablet TAKE 1 TABLET BY MOUTH EVERY DAY   docusate sodium (COLACE) 100 mg, Oral   fluticasone (FLONASE) 50 MCG/ACT nasal spray    levETIRAcetam (KEPPRA) 500 MG tablet 1 and 1/2 in am, 2 at night   montelukast (SINGULAIR) 5 MG chewable tablet Oral   montelukast (SINGULAIR) 10 mg, Oral, Daily   Multiple Vitamin (MULTIVITAMIN) capsule Oral   Oxcarbazepine (TRILEPTAL) 300 MG tablet TAKE 1 TABLET BY MOUTH TWICE A DAY   pyridOXINE (VITAMIN B6) 100 mg, 2 times daily   SODIUM FLUORIDE 5000 PLUS 1.1 % CREA  dental cream 1 application , 2 times daily   STRATTERA 80 MG capsule TAKE 1 CAPSULE (80 mg) BY MOUTH EVERY MORNING  Medication Side Effects: None Family Medical/ Social History: Changes? None   MENTAL HEALTH: Mental Health Issues: Anxiety-less with Strattera 80 mg daily.   PHYSICAL EXAM; Vitals:  Vitals:   08/17/22 1002  BP: 118/78  Pulse: 78  Resp: 16  Weight: 171 lb (77.6 kg)  Height: 5' 5.35" (1.66 m)    General Physical Exam: Unchanged from previous exam, date:05/11/2022 Changed: None  DIAGNOSES:    ICD-10-CM   1. ADHD (attention deficit hyperactivity disorder), combined type  F90.2     2. Seizure disorder ([place])  G40.909     3. Cerebral seizure ([place])  G40.909     4. Exotropia  H50.10     5. 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 History of learning disability  Z87.898     7. 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 History of anxiety  Z86.59     10. Chromosomal abnormality syndrome  Q99.9     11. 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 Medication management  Z79.899     14. Patient counseled  Z71.9     15. Goals of care, counseling/discussion  Z71.89     ASSESSMENT: Joe Alvarez is a 21 year old male with a history of ADHD, L/D, Dysgraphia, Dyspraxia, and Chromosomal Abnormalities.  Patient has been on Strattera 80 mg daily with no side effects. Not taking on a consistent basis, but has reminder and medication dispenser.  Volunteering has continued at [employer] a few days/week. Sleeping later during the day if not volunteering and staying up later at night. No sleep issues reported. Eating with no reported changes with daily MVI and B6 vitamins. No changes with health and has appt with Neurology in November. Has continued with socializing at the [facility] on Friday nights and bowling weekly. Continuation with current medication with no changes and support with regular medication dosing  daily.   RECOMMENDATIONS:  Updates with work and continued volunteering at [employer] a few days a week for 3-4 hours each day.  Vocational rehabilitation services discussed with limited success.   Staying active with bowling and interactive with the [facility] on Friday nights   Health updates with appointments and routine f/u for medication management for seizure d/o.  Sleep schedule discussed with current irregular sleep schedule daily.   Medication management discussed with current dose and suggested daily dosing with morning alarm.   Suggested taking medication upon waking each day, even if a few hours later.   Counseled medication pharmacokinetics, options, dosage, administration, desired effects, and possible side effects.   Strattera 80 mg daily, #30 with 2 RF's.Grayce Sessions for above e-scribed and sent to pharmacy on record  [facility], [place] [address] [place] [address] Phone: [phone] Fax: [phone]  I discussed the assessment and treatment plan with the patient & parent. The patient & parent was provided an opportunity to ask questions and all were answered. The patient & parent agreed with the plan and demonstrated an understanding of the instructions.  NEXT APPOINTMENT: Return in about 3 months (around 11/17/2022) for f/u visit .  The patient & parent was advised to call back or seek an in-person evaluation if the symptoms worsen or if the condition fails to improve as anticipated.  Carolann Littler, NP   Counseling Time: 48 mins Total Contact Time: 52 mins

## 2022-09-11 ENCOUNTER — Other Ambulatory Visit: Payer: Self-pay | Admitting: Family

## 2022-11-23 ENCOUNTER — Encounter: Payer: Self-pay | Admitting: Family

## 2022-11-23 ENCOUNTER — Ambulatory Visit (INDEPENDENT_AMBULATORY_CARE_PROVIDER_SITE_OTHER): Payer: Medicaid Other | Admitting: Family

## 2022-11-23 VITALS — BP 122/84 | HR 78 | Resp 16 | Ht 65.55 in | Wt 178.0 lb

## 2022-11-23 DIAGNOSIS — F902 Attention-deficit hyperactivity disorder, combined type: Secondary | ICD-10-CM

## 2022-11-23 DIAGNOSIS — G40909 Epilepsy, unspecified, not intractable, without status epilepticus: Secondary | ICD-10-CM

## 2022-11-23 DIAGNOSIS — Q9388 Other microdeletions: Secondary | ICD-10-CM | POA: Diagnosis not present

## 2022-11-23 DIAGNOSIS — Z7189 Other specified counseling: Secondary | ICD-10-CM

## 2022-11-23 DIAGNOSIS — R278 Other lack of coordination: Secondary | ICD-10-CM | POA: Diagnosis not present

## 2022-11-23 DIAGNOSIS — H5010 Unspecified exotropia: Secondary | ICD-10-CM

## 2022-11-23 DIAGNOSIS — Q998 Other specified chromosome abnormalities: Secondary | ICD-10-CM

## 2022-11-23 DIAGNOSIS — Z87898 Personal history of other specified conditions: Secondary | ICD-10-CM | POA: Diagnosis not present

## 2022-11-23 DIAGNOSIS — Z79899 Other long term (current) drug therapy: Secondary | ICD-10-CM

## 2022-11-23 DIAGNOSIS — Z719 Counseling, unspecified: Secondary | ICD-10-CM

## 2022-11-23 MED ORDER — STRATTERA 80 MG PO CAPS: ORAL_CAPSULE | ORAL | 6 refills | Status: AC

## 2022-11-23 NOTE — Progress Notes (Signed)
[place], [address] [place] [address] Dept: [phone] Dept Fax: [phone] Loc: [phone] Loc Fax: [phone]  Medication Check  Patient ID: Joe Alvarez, male  DOB: 05-22-2001, 22 y.o.  MRN: [id number]  Date of Evaluation: 11/23/2022 PCP: Darrol Jump, NP  Accompanied by: Mother Patient Lives with: parents and sibling  HISTORY/CURRENT STATUS: HPI Patient here with mother for the visit today. Patient interactive and answering questions today. No significant changes reported since last f/u visit 08/17/2022. Has continued with Strattera 80 mg daily with good efficacy.   EDUCATION/WORK: Volunteering: [employer] 9-11 or 1:00 pm  Several days/week depending on the days and schedule with brother Activities/ Exercise:  Bowling   MEDICAL HISTORY: Appetite: Good  MVI/Other: None reported  Sleep: Bedtime: 2400-0100  Awakens: 0720 to put his watch on  and goes back to bed until 1200-1300  Concerns: Initiation/Maintenance/Other: Up later and sleeping in later most day with mother having to wake him up most days.   Individual Medical History/ Review of Systems: Changes? :Yes had bronchitis. F/u with neurology in November 2023.  Allergies: Atropine, Cyclopentolate, Tetanus toxoids, and Other  Current Medications:  Current Outpatient Medications  Medication Instructions   cetirizine (ZYRTEC) 10 MG tablet TAKE 1 TABLET BY MOUTH EVERY DAY   docusate sodium (COLACE) 100 mg, Oral   fluticasone (FLONASE) 50 MCG/ACT nasal spray    levETIRAcetam (KEPPRA) 500 MG tablet 1 and 1/2 in am, 2 at night   montelukast (SINGULAIR) 5 MG chewable tablet Oral   montelukast (SINGULAIR) 10 mg, Oral, Daily   Multiple Vitamin (MULTIVITAMIN) capsule Oral   Oxcarbazepine (TRILEPTAL) 300 MG tablet TAKE 1 TABLET BY MOUTH TWICE A DAY   pyridOXINE  (VITAMIN B6) 100 mg, 2 times daily   SODIUM FLUORIDE 5000 PLUS 1.1 % CREA dental cream 1 application , 2 times daily   STRATTERA 80 MG capsule TAKE 1 CAPSULE (80 mg) BY MOUTH EVERY MORNING  Medication Side Effects: None Family Medical/ Social History: Changes? None reported  MENTAL HEALTH: Mental Health Issues: None  PHYSICAL EXAM; Vitals:  Vitals:   11/23/22 1011  BP: 122/84  Pulse: 78  Resp: 16  Weight: 178 lb (80.7 kg)  Height: 5' 5.55" (1.665 m)    General Physical Exam: Unchanged from previous exam, date:08/17/2022. Changed:None  DIAGNOSES:    ICD-10-CM   1. ADHD (attention deficit hyperactivity disorder), combined type  F90.2     2. 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 History of learning disability  Z87.898     4. Dysgraphia  R27.8     5. Dyspraxia  R27.8     6. Seizure disorder ([place])  G40.909     7. Exotropia  H50.10     8. Medication management  Z79.899     9. Patient counseled  Z71.9     10. Goals of care, counseling/discussion  Z71.89     11. 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 Strattera 80 mg taking not on a regular basis. Mother attempting to remind him to take his medication daily. Still volunteering a few days/week at [employer]. Has continued with searching for jobs locally with limited assistance from [facility]  [employer] or other businesses in [place]. Eating with no concerns. Had f/u with Neurology with no changes to medications. Sleeping at least 10-12 hours each night. No medication changes today with adherence discussed.   RECOMMENDATIONS:  Updates with volunteering and limited progression with availability of work.  Discussed options with local entities and provided contact information for the [employer] .  Discussed frustrations with being at home and suggested more  assistance with chores.  Continuation with bowling and socializing at the [facility] on Friday Nights.   Health updates discussed with no changes with his seizures or medication dose.   Limiting sleep hours to 8-10 hours each night with a routine needed.   Medication management discussed with daily dosing and provided recommendation for adherence.   Counseled medication pharmacokinetics, options, dosage, administration, desired effects, and possible side effects.   Strattera 80 mg daily, #30 with 6 RF's RX for above e-scribed and sent to pharmacy on record  [facility] [phone] [address], [address] AT [place] [address] [place] [address] Phone: [phone] Fax: [phone]  I discussed the assessment and treatment plan with the patient & parent. The patient & parent was provided an opportunity to ask questions and all were answered. The patient & parent agreed with the plan and demonstrated an understanding of the instructions.   NEXT APPOINTMENT: Return in about 3 months (around 02/22/2023) for f/u visit .  The patient & parent was advised to call back or seek an in-person evaluation if the symptoms worsen or if the condition fails to improve as anticipated.   Carolann Littler, NP  Counseling Time: 50 mins Total Contact Time: 58 mins

## 2023-02-13 ENCOUNTER — Institutional Professional Consult (permissible substitution): Payer: Medicaid Other | Admitting: Family

## 2023-04-26 ENCOUNTER — Institutional Professional Consult (permissible substitution): Payer: Medicaid Other | Admitting: Family
